# Patient Record
Sex: Female | Born: 1980 | Race: Black or African American | Hispanic: No | Marital: Single | Smoking: Current every day smoker
Health system: Southern US, Community
[De-identification: ages and names within clinical notes are randomized; demographics above are authoritative.]

## PROBLEM LIST (undated history)

## (undated) DIAGNOSIS — K861 Other chronic pancreatitis: Secondary | ICD-10-CM

## (undated) DIAGNOSIS — K863 Pseudocyst of pancreas: Secondary | ICD-10-CM

## (undated) DIAGNOSIS — I10 Essential (primary) hypertension: Secondary | ICD-10-CM

## (undated) DIAGNOSIS — D259 Leiomyoma of uterus, unspecified: Secondary | ICD-10-CM

## (undated) HISTORY — PX: ABDOMINAL HYSTERECTOMY: SHX81

---

## 2013-04-14 ENCOUNTER — Other Ambulatory Visit: Payer: Self-pay | Admitting: Gastroenterology

## 2013-04-14 DIAGNOSIS — R109 Unspecified abdominal pain: Secondary | ICD-10-CM

## 2013-04-16 ENCOUNTER — Ambulatory Visit
Admission: RE | Admit: 2013-04-16 | Discharge: 2013-04-16 | Disposition: A | Discharge status: Home / Self Care | Payer: No Typology Code available for payment source | Source: Ambulatory Visit | Attending: Gastroenterology | Admitting: Gastroenterology

## 2013-04-16 DIAGNOSIS — R109 Unspecified abdominal pain: Secondary | ICD-10-CM

## 2013-04-28 ENCOUNTER — Other Ambulatory Visit: Payer: Self-pay | Admitting: Gastroenterology

## 2013-04-28 DIAGNOSIS — K859 Acute pancreatitis without necrosis or infection, unspecified: Secondary | ICD-10-CM

## 2013-04-29 ENCOUNTER — Ambulatory Visit
Admission: RE | Admit: 2013-04-29 | Discharge: 2013-04-29 | Disposition: A | Discharge status: Home / Self Care | Payer: No Typology Code available for payment source | Source: Ambulatory Visit | Attending: Gastroenterology | Admitting: Gastroenterology

## 2013-04-29 DIAGNOSIS — K859 Acute pancreatitis without necrosis or infection, unspecified: Secondary | ICD-10-CM

## 2016-04-20 ENCOUNTER — Encounter (HOSPITAL_COMMUNITY): Payer: Self-pay | Admitting: *Deleted

## 2016-04-20 ENCOUNTER — Emergency Department (HOSPITAL_COMMUNITY)
Admission: EM | Admit: 2016-04-20 | Discharge: 2016-04-20 | Disposition: A | Discharge status: Home / Self Care | Payer: BLUE CROSS/BLUE SHIELD | Attending: Emergency Medicine | Admitting: Emergency Medicine

## 2016-04-20 ENCOUNTER — Emergency Department (HOSPITAL_COMMUNITY): Payer: BLUE CROSS/BLUE SHIELD

## 2016-04-20 DIAGNOSIS — Z86018 Personal history of other benign neoplasm: Secondary | ICD-10-CM | POA: Diagnosis not present

## 2016-04-20 DIAGNOSIS — R1013 Epigastric pain: Secondary | ICD-10-CM

## 2016-04-20 DIAGNOSIS — R110 Nausea: Secondary | ICD-10-CM | POA: Insufficient documentation

## 2016-04-20 DIAGNOSIS — R1011 Right upper quadrant pain: Secondary | ICD-10-CM | POA: Insufficient documentation

## 2016-04-20 DIAGNOSIS — F17210 Nicotine dependence, cigarettes, uncomplicated: Secondary | ICD-10-CM | POA: Insufficient documentation

## 2016-04-20 DIAGNOSIS — Z79899 Other long term (current) drug therapy: Secondary | ICD-10-CM | POA: Diagnosis not present

## 2016-04-20 DIAGNOSIS — I10 Essential (primary) hypertension: Secondary | ICD-10-CM | POA: Insufficient documentation

## 2016-04-20 HISTORY — DX: Essential (primary) hypertension: I10

## 2016-04-20 HISTORY — DX: Leiomyoma of uterus, unspecified: D25.9

## 2016-04-20 LAB — URINALYSIS, ROUTINE W REFLEX MICROSCOPIC
Bilirubin Urine: NEGATIVE
GLUCOSE, UA: NEGATIVE mg/dL
Hgb urine dipstick: NEGATIVE
KETONES UR: 40 mg/dL — AB
Leukocytes, UA: NEGATIVE
Nitrite: NEGATIVE
PROTEIN: NEGATIVE mg/dL
Specific Gravity, Urine: 1.017 (ref 1.005–1.030)
pH: 7 (ref 5.0–8.0)

## 2016-04-20 LAB — CBC
HCT: 37.4 % (ref 36.0–46.0)
HEMOGLOBIN: 13.2 g/dL (ref 12.0–15.0)
MCH: 34.7 pg — ABNORMAL HIGH (ref 26.0–34.0)
MCHC: 35.3 g/dL (ref 30.0–36.0)
MCV: 98.4 fL (ref 78.0–100.0)
PLATELETS: 468 10*3/uL — AB (ref 150–400)
RBC: 3.8 MIL/uL — AB (ref 3.87–5.11)
RDW: 15.6 % — ABNORMAL HIGH (ref 11.5–15.5)
WBC: 6.4 10*3/uL (ref 4.0–10.5)

## 2016-04-20 LAB — LIPASE, BLOOD: LIPASE: 41 U/L (ref 11–51)

## 2016-04-20 LAB — COMPREHENSIVE METABOLIC PANEL
ALT: 12 U/L — AB (ref 14–54)
ANION GAP: 13 (ref 5–15)
AST: 18 U/L (ref 15–41)
Albumin: 4.6 g/dL (ref 3.5–5.0)
Alkaline Phosphatase: 62 U/L (ref 38–126)
BUN: <5 mg/dL — ABNORMAL LOW (ref 6–20)
CALCIUM: 10.4 mg/dL — AB (ref 8.9–10.3)
CO2: 22 mmol/L (ref 22–32)
CREATININE: 0.77 mg/dL (ref 0.44–1.00)
Chloride: 103 mmol/L (ref 101–111)
Glucose, Bld: 113 mg/dL — ABNORMAL HIGH (ref 65–99)
Potassium: 3.4 mmol/L — ABNORMAL LOW (ref 3.5–5.1)
Sodium: 138 mmol/L (ref 135–145)
Total Bilirubin: 1.1 mg/dL (ref 0.3–1.2)
Total Protein: 7.6 g/dL (ref 6.5–8.1)

## 2016-04-20 MED ORDER — FAMOTIDINE 20 MG PO TABS: 20.0000 mg | ORAL_TABLET | Freq: Two times a day (BID) | ORAL | Status: DC

## 2016-04-20 MED ORDER — ONDANSETRON HCL 4 MG/2ML IJ SOLN: 4.0000 mg | Freq: Once | INTRAMUSCULAR | Status: DC

## 2016-04-20 MED ORDER — GI COCKTAIL ~~LOC~~
30.0000 mL | Freq: Once | ORAL | Status: AC
  Administered 2016-04-20: 30 mL via ORAL
  Filled 2016-04-20: qty 30

## 2016-04-20 MED ORDER — SODIUM CHLORIDE 0.9 % IV BOLUS (SEPSIS)
1000.0000 mL | Freq: Once | INTRAVENOUS | Status: AC
  Administered 2016-04-20: 1000 mL via INTRAVENOUS

## 2016-04-20 NOTE — ED Notes (Signed)
Pt reports mid abdominal pain and nausea since Wednesday. Denies any urinary symptoms.

## 2016-04-20 NOTE — ED Provider Notes (Signed)
CSN: [id number]     Arrival date & time 04/20/16  1748 History   First MD Initiated Contact with Patient 04/20/16 1858     Chief Complaint  Patient presents with  . Abdominal Pain     (Consider location/radiation/quality/duration/timing/severity/associated sxs/prior Treatment) Patient is a 35 y.o. female presenting with general illness. The history is provided by the patient.  Illness Location:  Epigastric pain Severity:  Moderate Onset quality:  Gradual Duration:  4 days Timing:  Constant Progression:  Worsening Chronicity:  New Context:  S/p hysterectomy several years ago. 4 days of epigastric pain. Nausea in the morning. No emesis or diarrhea. No vaginal bleeding or vaginal discharge. No urinary symptoms. No fevers or chills. No chest pain or shortness of breath. No other infectious symptoms. Associated symptoms: abdominal pain and nausea   Associated symptoms: no chest pain, no cough, no diarrhea, no fever, no headaches, no rash, no shortness of breath and no vomiting     Past Medical History  Diagnosis Date  . Hypertension   . Uterine fibroid    Past Surgical History  Procedure Laterality Date  . Abdominal hysterectomy     No family history on file. Social History  Substance Use Topics  . Smoking status: Current Every Day Smoker -- 0.25 packs/day    Types: Cigarettes  . Smokeless tobacco: None  . Alcohol Use: None   OB History    No data available     Review of Systems  Constitutional: Negative for fever and chills.  Respiratory: Negative for cough and shortness of breath.   Cardiovascular: Negative for chest pain.  Gastrointestinal: Positive for nausea and abdominal pain. Negative for vomiting, diarrhea and blood in stool.  Genitourinary: Negative for dysuria, urgency, frequency, flank pain, vaginal bleeding and vaginal discharge.  Musculoskeletal: Negative for back pain.  Skin: Negative for rash.  Neurological: Negative for light-headedness and headaches.   All other systems reviewed and are negative.     Allergies  Review of patient's allergies indicates no known allergies.  Home Medications   Prior to Admission medications   Medication Sig Start Date End Date Taking? Authorizing Provider  lisinopril (PRINIVIL,ZESTRIL) 10 MG tablet Take 10 mg by mouth daily.   Yes Historical Provider, MD  Multiple Vitamin (MULTIVITAMIN WITH MINERALS) TABS tablet Take 1 tablet by mouth daily.   Yes Historical Provider, MD  famotidine (PEPCID) 20 MG tablet Take 1 tablet (20 mg total) by mouth 2 (two) times daily. 04/20/16   Maryan Puls, MD   BP 169/108 mmHg  Pulse 83  Temp(Src) 98.4 F (36.9 C) (Oral)  Resp 16  Ht 5\' 5"  (1.651 m)  Wt 65.998 kg  BMI 24.21 kg/m2  SpO2 99% Physical Exam  Constitutional: She is oriented to person, place, and time. She appears well-developed and well-nourished. No distress.  HENT:  Head: Normocephalic and atraumatic.  Mouth/Throat: Mucous membranes are not dry.  Eyes: EOM are normal. Pupils are equal, round, and reactive to light.  Neck: Normal range of motion.  Cardiovascular: Normal rate and regular rhythm.   Pulmonary/Chest: No tachypnea. No respiratory distress.  Abdominal: Soft. Normal appearance. There is tenderness in the right upper quadrant and epigastric area. There is guarding. There is no rebound, no CVA tenderness, no tenderness at McBurney's point and negative Murphy's sign.  Neurological: She is alert and oriented to person, place, and time. GCS eye subscore is 4. GCS verbal subscore is 5. GCS motor subscore is 6.  Skin: Skin is warm and dry.  ED Course  Procedures (including critical care time) Labs Review Labs Reviewed  COMPREHENSIVE METABOLIC PANEL - Abnormal; Notable for the following:    Potassium 3.4 (*)    Glucose, Bld 113 (*)    BUN <5 (*)    Calcium 10.4 (*)    ALT 12 (*)    All other components within normal limits  CBC - Abnormal; Notable for the following:    RBC 3.80 (*)     MCH 34.7 (*)    RDW 15.6 (*)    Platelets 468 (*)    All other components within normal limits  URINALYSIS, ROUTINE W REFLEX MICROSCOPIC (NOT AT [facility]) - Abnormal; Notable for the following:    APPearance CLOUDY (*)    Ketones, ur 40 (*)    All other components within normal limits  LIPASE, BLOOD    Imaging Review US Abdomen Complete  04/20/2016  CLINICAL DATA:  Epigastric and RIGHT upper quadrant pain with nausea since Wednesday night, history hypertension and smoking, initial encounter EXAM: ABDOMEN ULTRASOUND COMPLETE COMPARISON:  CT abdomen 04/29/2013, ultrasound abdomen 04/16/2013 FINDINGS: Gallbladder: Normally distended without stones or wall thickening. No pericholecystic fluid or sonographic Murphy sign. Common bile duct: Diameter: 6 mm diameter, upper normal. Liver: Upper normal echogenicity. Hepatopetal portal venous flow. Small hyperechoic nodule superiorly RIGHT lobe 19 x 11 x 9 mm. Additional LEFT lobe hyperechoic nodule 9 x 8 x 12 mm. No intrahepatic biliary dilatation. IVC: Normal appearance Pancreas: Incomplete visualization of head and tail due to bowel gas. Visualized portions normal appearance. Spleen: Normal appearance, 6.5 cm length Right Kidney: Length: 11.0 cm. Normal morphology without mass or hydronephrosis. Left Kidney: Length: 11.5 cm. Normal morphology without mass or hydronephrosis. Abdominal aorta: Normal caliber Other findings: No free fluid IMPRESSION: Two small hyperechoic foci are identified within the liver, measuring 19 mm and 12 mm in greatest size is. Potentially these could represent small hepatic hemangiomata though not diagnostic. These are not definitely seen on the prior ultrasound nor on the prior CT though this was limited by noncontrast technique. In the absence of a history of malignancy, recommend followup ultrasound in 6 months to establish stability. No additional sonographic abnormalities identified. Electronically Signed   By: Lavonia Dana M.D.   On:  04/20/2016 22:44   I have personally reviewed and evaluated these images and lab results as part of my medical decision-making.   EKG Interpretation None      MDM   Final diagnoses:  Epigastric pain    35 year old female presenting with epigastric pain. Feel most likely diagnosed with the patient is gastritis versus gastric or peptic ulcer disease. Patient is better and having some nausea without any vomiting. Pain gets worse after eating. She has some tenderness in her epigastric as well as her right upper quadrant. Got a right upper quadrant ultrasound which didn't show any evidence of gallbladder pathology. Lipase is normal's about pancreatitis. Abdomen was otherwise benign so doubt acute abdomen. Gave the patient a GI cocktail here with improvement in symptoms.  The patient is hypertensive here. She reports that she does have a history of hypertension. She reports that she takes her hypertensive medications as prescribed usually but forgot to today. Vital signs otherwise within normal limits. Not tachycardic tachypneic or hypoxic.  Got labs on the patient. No O abnormalities. No leukocytosis. No anemia. No evidence of UTI on urinalysis. She is not having any chest pain or shortness of breath to indicate acute pathology such as pneumonia pneumothorax or ACS.  She denies any vaginal bleeding or vaginal discharge. The patient is status post hysterectomy so not concerned about IUP.  We'll give the patient a prescription for Pepcid. Encouraged her to avoid spicy foods acidic foods. She does endorse drinking 2 glasses of wine per night on weeknights, and 3-4 glasses of wine per day on weekends. Told her to decrease alcohol intake and to avoid ibuprofen and Motrin use.  Patient does not have a primary care doctor. Referred her to primary care doctor here. Suggested that she consider following up with gastroenterology should she have persistence of symptoms after starting on Pepcid. Encouraged her  to use Tums when necessary for pain until the Pepcid starts working.  Strict return precautions provided regarding worsening abdominal pain, onset of fevers, inability to tolerate by mouth, or intractable vomiting and diarrhea.  Patient discharged in stable condition.  Maryan Puls, MD 04/21/16 [id number]  Pattricia Boss, MD 04/24/16 [id number]

## 2016-04-20 NOTE — Discharge Instructions (Signed)
[employer] Social Services [employer] is a great source of information about community services available.  Access by dialing 2-1-1 from anywhere in [place], or by website -  CustodianSupply.fi.   Other Local Resources (Updated 12/2015)  Social  Nutritional therapist Number and Address  Aging, Disability and [place] on wheels  Community meal sites  Transportation services  Adult day health care  [facility] [place] caregiver support services [place], [place]  Guardianship  Hearing loss (assistive technology, interpreters, etc.)  Low-income services (health care, child care, housing, financial and nutrition assistance)  Medicaid  Pregnancy services  Utility assistance  Veterans services [phone] [address], [place] [address]   [place] management  Family education  Information and referral [phone] [address], [place] [address]  [place] Social Services  Aging and [place] services  Deaf-Blind services  Disability services  Guardianship  Hearing loss (assistive technology, interpreters, etc.)  Low-income services (health care, child care, housing, financial and nutrition assistance)  Medicaid  Pregnancy services  Utility assistance  Veterans services [phone] [address] [place], [place] [address]  [place]  Aging and [place] services  Guardianship  Hearing loss (assistive technology, interpreters, etc.)  Low-income services (health care, child care, housing, financial and nutrition  assistance)  [facility]  Pregnancy services  Utility assistance  Veterans services [phone] [place], [place] [address]  [place] services  Guardianship  Hearing loss (assistive technology, interpreters, etc.)  Low-income services (health care, child care, housing, financial and nutrition assistance)  Medicaid  Pregnancy services  Utility assistance  Veterans services [phone] [place], [place] [address]   [place]  Aging and [place]  Hearing loss (assistive technology, interpreters, etc.)  Low-income services (health care, child care, housing, financial and nutrition assistance)  Medicaid  Pregnancy services  Utility assistance  Veterans services [phone] [address], [place], [place] [address]  [place] [facility] Social Services  Aging and [place]  Hearing loss (assistive technology, interpreters, etc.)  Low-income services (health care, child care, housing, financial and nutrition assistance)  Medicaid  Pregnancy services  Utility assistance  Veterans services [phone] [address], [place] [address]  [employer] of [facility] [place]  Serves adults age 73 and over and their families  Caregiver information  Foster grandparents  Special educational needs teacher meals  Refugee programs  Retired Energy manager (RSVP)  [facility])  [place]  [phone]  [address], [place] [address]  [phone]  [place], [place] [address]  [employer]  Help Line  Home Care  Living at [employer] on [place]  Support Groups  Care Partner Workshops  Referrals for chore and homemaker services [phone]  [address] [place], [place] [address]  The [place] assistance  Medication assistance  Rental assistance  Food pantry  Medication assistance  Housing assistance  Emergency food distribution  Utility assistance  [place] [facility] [place] [address] [place], [place] [phone] and Thursdays from [place] - 12 noon by appointment only) [address] [place], [place] [address]  [phone] [address] [place], [place] [address]

## 2016-04-21 ENCOUNTER — Telehealth: Payer: Self-pay | Admitting: *Deleted

## 2016-04-21 NOTE — Telephone Encounter (Signed)
Pharmacist suggested substitution of Zantac 150 mg po liquid, Bid. This will also be covered by insurance which was another concern. No further CM concerns at this time.

## 2016-09-30 IMAGING — US US ABDOMEN COMPLETE
1 series · 13 of 25 positions shown · non-contrast
Comparison: CT abdomen 04/29/2013, ultrasound abdomen 04/16/2013

CLINICAL DATA: Epigastric and RIGHT upper quadrant pain with nausea
since [REDACTED] night, history hypertension and smoking, initial
encounter

EXAM:
ABDOMEN ULTRASOUND COMPLETE

[Series 1: us abdomen complete · 0.15mm/px · 13 of 89 slices shown]
[im 1/89]
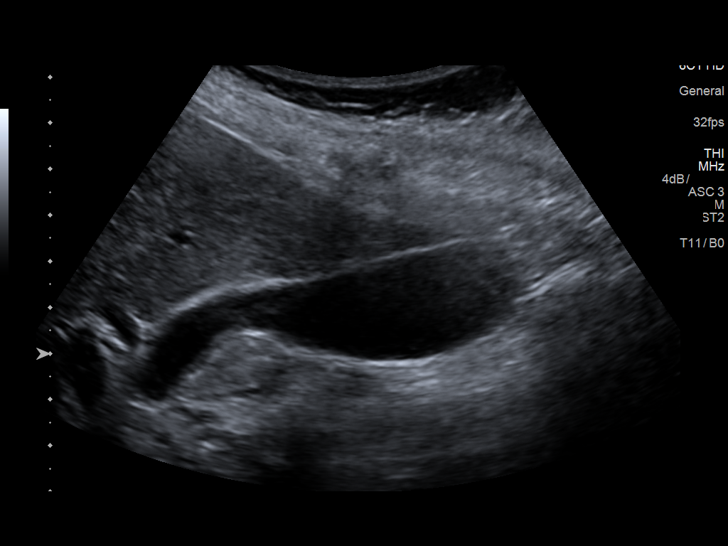
[im 8/89]
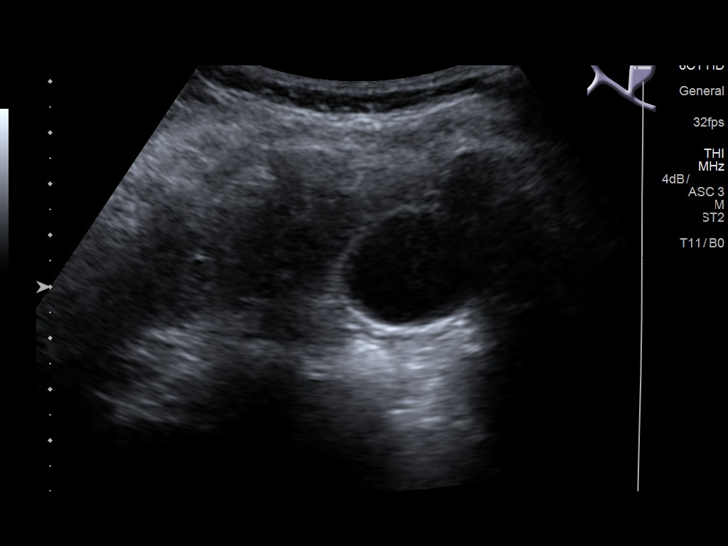
[im 15/89]
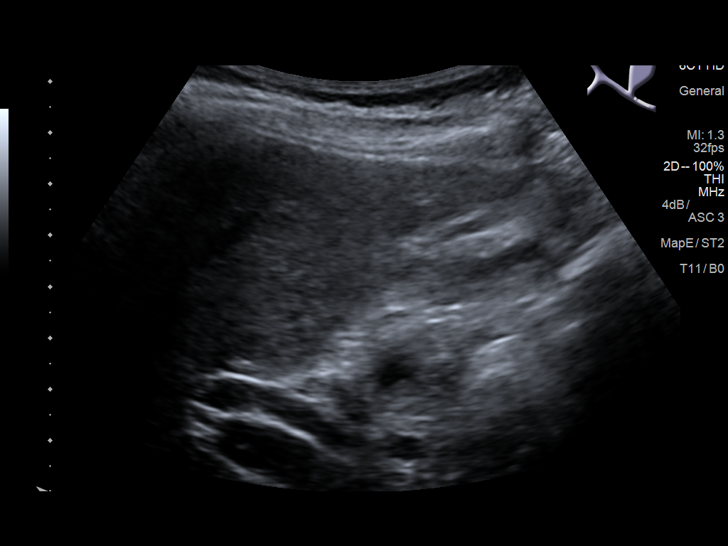
[im 23/89]
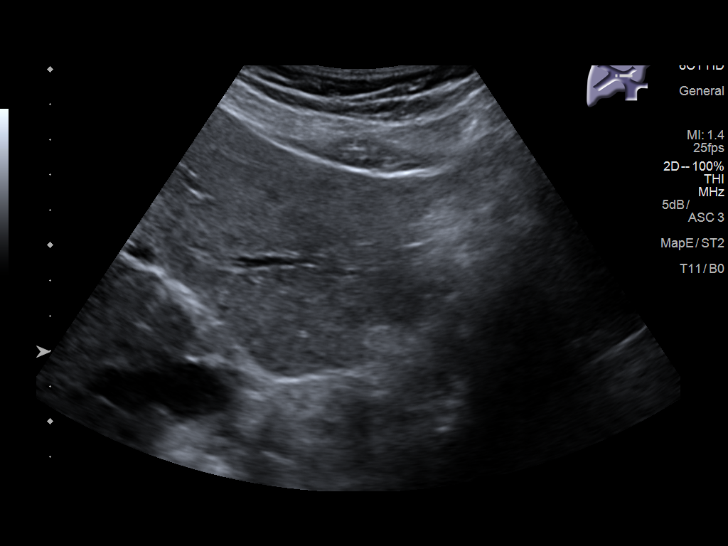
[im 30/89]
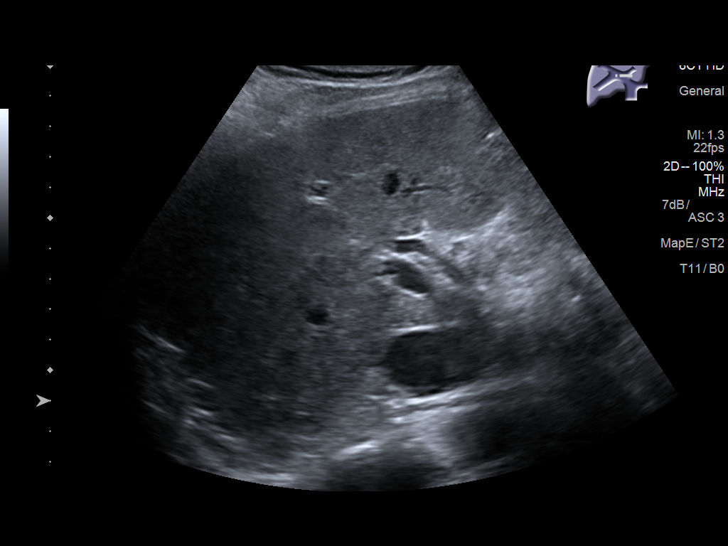
[im 37/89]
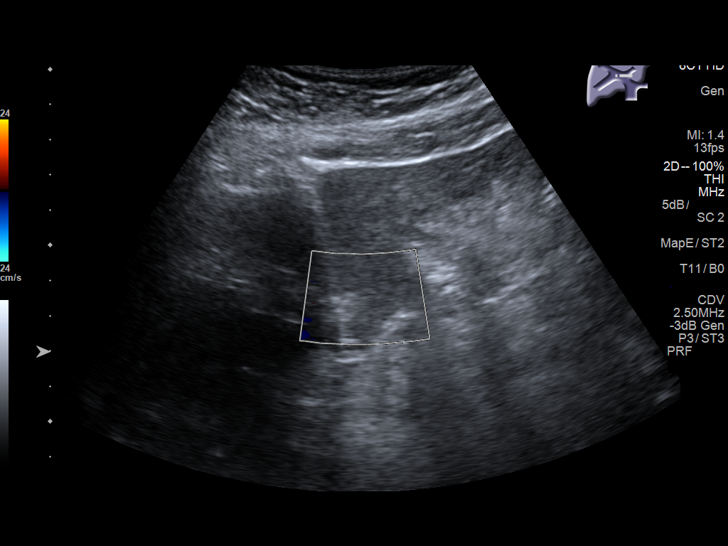
[im 45/89]
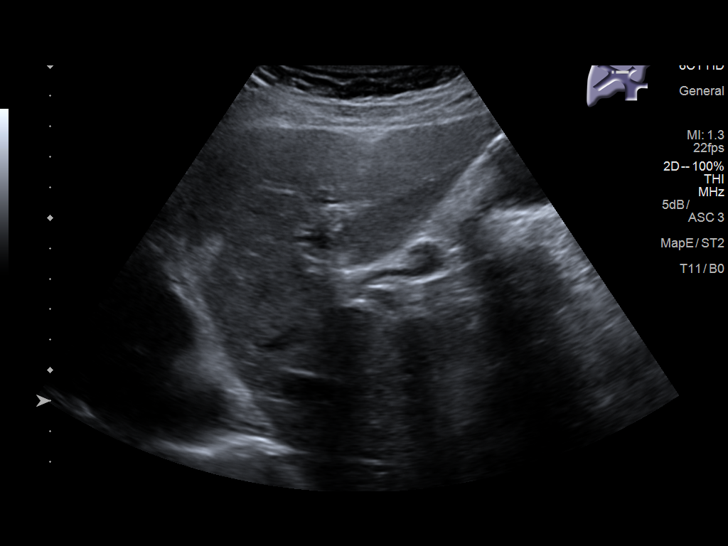
[im 52/89]
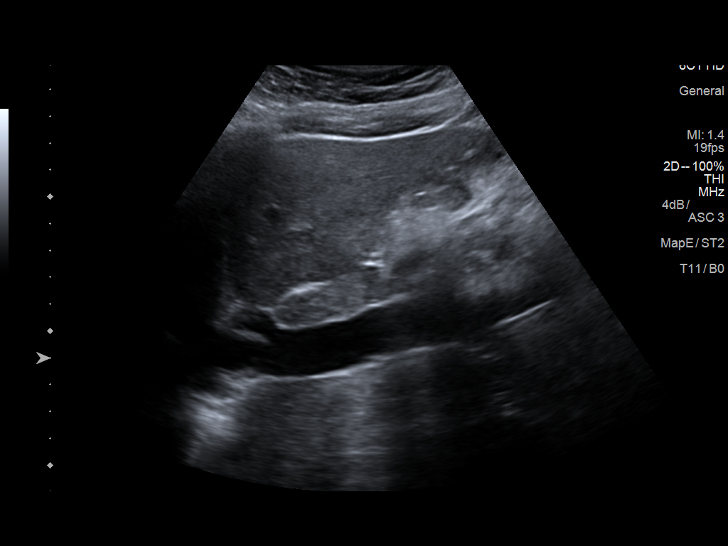
[im 59/89]
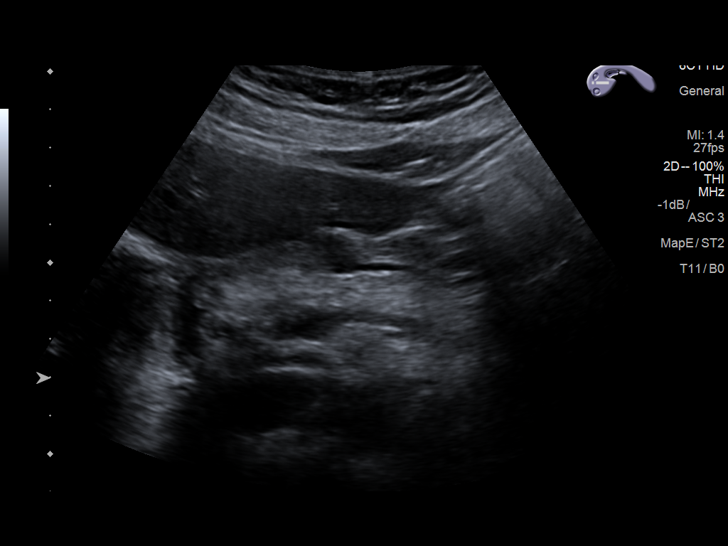
[im 67/89]
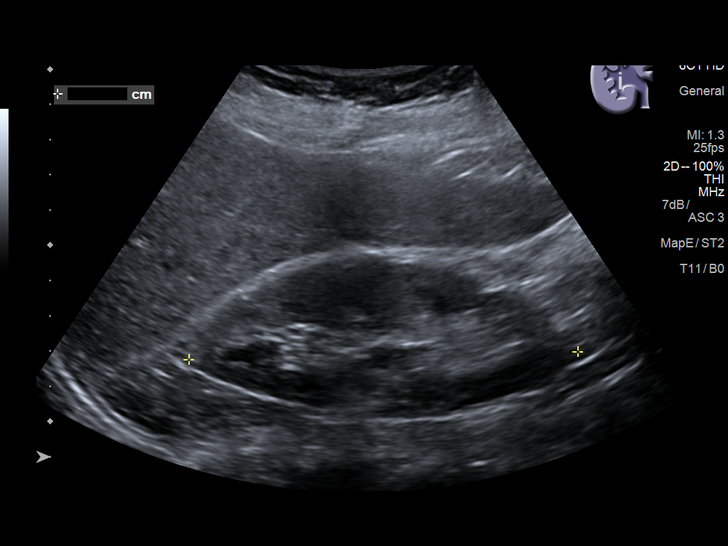
[im 74/89]
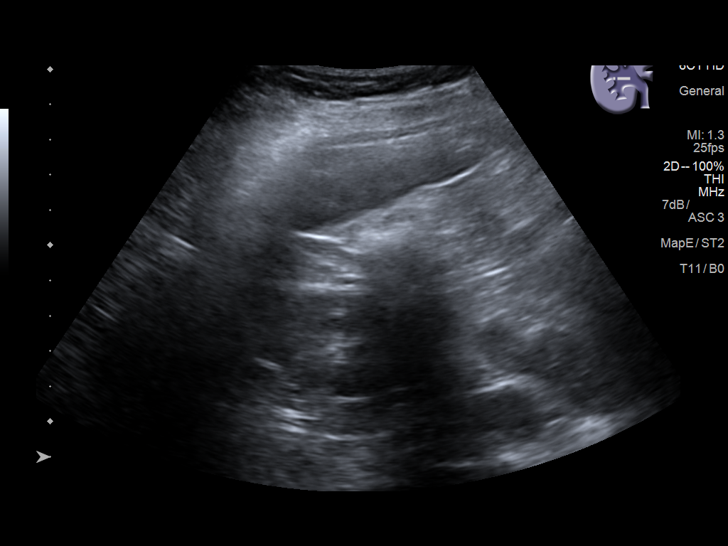
[im 81/89]
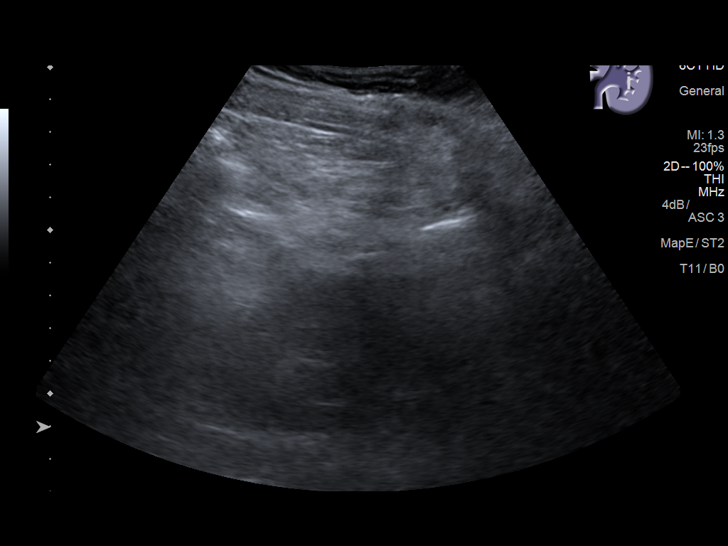
[im 89/89]
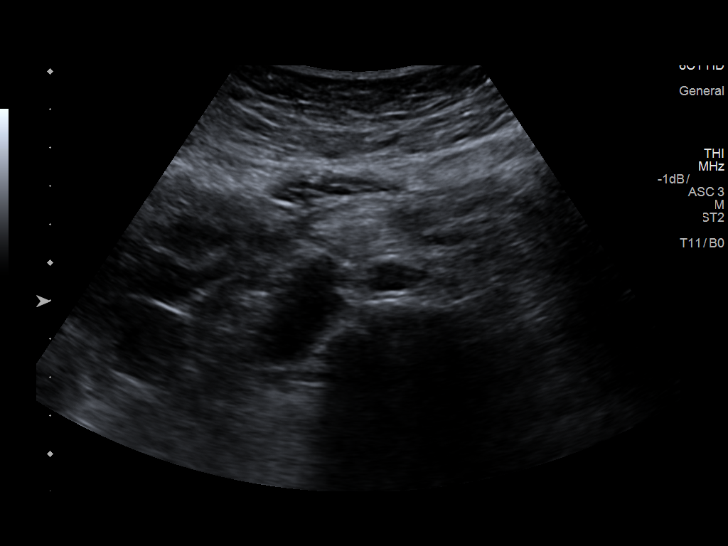

[13 of 25 positions shown; findings below may reference images not displayed]

FINDINGS: Gallbladder: Normally distended without stones or wall thickening.
No pericholecystic fluid or sonographic Murphy sign.

Common bile duct: Diameter: 6 mm diameter, upper normal.

Liver: Upper normal echogenicity. Hepatopetal portal venous flow.
Small hyperechoic nodule superiorly RIGHT lobe 19 x 11 x 9 mm.
Additional LEFT lobe hyperechoic nodule 9 x 8 x 12 mm. No
intrahepatic biliary dilatation.

IVC: Normal appearance

Pancreas: Incomplete visualization of head and tail due to bowel
gas. Visualized portions normal appearance.

Spleen: Normal appearance, 6.5 cm length

Right Kidney: Length: 11.0 cm. Normal morphology without mass or
hydronephrosis.

Left Kidney: Length: 11.5 cm. Normal morphology without mass or
hydronephrosis.

Abdominal aorta: Normal caliber

Other findings: No free fluid
IMPRESSION: Two small hyperechoic foci are identified within the liver,
measuring 19 mm and 12 mm in greatest size is.

Potentially these could represent small hepatic hemangiomata though
not diagnostic.

These are not definitely seen on the prior ultrasound nor on the
prior CT though this was limited by noncontrast technique.

In the absence of a history of malignancy, recommend followup
ultrasound in 6 months to establish stability.

No additional sonographic abnormalities identified.

## 2020-03-16 DIAGNOSIS — E0865 Diabetes mellitus due to underlying condition with hyperglycemia: Secondary | ICD-10-CM

## 2020-03-16 DIAGNOSIS — IMO0002 Reserved for concepts with insufficient information to code with codable children: Secondary | ICD-10-CM

## 2020-03-16 HISTORY — DX: Diabetes mellitus due to underlying condition with hyperglycemia: E08.65

## 2020-03-16 HISTORY — DX: Reserved for concepts with insufficient information to code with codable children: IMO0002

## 2020-04-13 ENCOUNTER — Other Ambulatory Visit: Payer: Self-pay

## 2020-04-13 ENCOUNTER — Emergency Department (HOSPITAL_COMMUNITY)
Admission: EM | Admit: 2020-04-13 | Discharge: 2020-04-13 | Discharge status: Absent without leave | Payer: BLUE CROSS/BLUE SHIELD

## 2020-07-01 ENCOUNTER — Encounter (HOSPITAL_COMMUNITY): Payer: Self-pay | Admitting: Internal Medicine

## 2020-07-01 ENCOUNTER — Emergency Department (HOSPITAL_COMMUNITY): Payer: No Typology Code available for payment source

## 2020-07-01 ENCOUNTER — Other Ambulatory Visit: Payer: Self-pay

## 2020-07-01 ENCOUNTER — Inpatient Hospital Stay (HOSPITAL_COMMUNITY)
Admission: EM | Admit: 2020-07-01 | Discharge: 2020-07-03 | DRG: 638 | Disposition: A | Discharge status: Home / Self Care | Payer: No Typology Code available for payment source | Attending: Internal Medicine | Admitting: Internal Medicine

## 2020-07-01 DIAGNOSIS — F17210 Nicotine dependence, cigarettes, uncomplicated: Secondary | ICD-10-CM

## 2020-07-01 DIAGNOSIS — Z90710 Acquired absence of both cervix and uterus: Secondary | ICD-10-CM

## 2020-07-01 DIAGNOSIS — E11649 Type 2 diabetes mellitus with hypoglycemia without coma: Principal | ICD-10-CM | POA: Diagnosis present

## 2020-07-01 DIAGNOSIS — K861 Other chronic pancreatitis: Secondary | ICD-10-CM

## 2020-07-01 DIAGNOSIS — R634 Abnormal weight loss: Secondary | ICD-10-CM | POA: Diagnosis present

## 2020-07-01 DIAGNOSIS — Z20822 Contact with and (suspected) exposure to covid-19: Secondary | ICD-10-CM | POA: Diagnosis present

## 2020-07-01 DIAGNOSIS — Z833 Family history of diabetes mellitus: Secondary | ICD-10-CM

## 2020-07-01 DIAGNOSIS — E0865 Diabetes mellitus due to underlying condition with hyperglycemia: Secondary | ICD-10-CM

## 2020-07-01 DIAGNOSIS — K863 Pseudocyst of pancreas: Secondary | ICD-10-CM | POA: Diagnosis present

## 2020-07-01 DIAGNOSIS — Z79899 Other long term (current) drug therapy: Secondary | ICD-10-CM

## 2020-07-01 DIAGNOSIS — IMO0002 Reserved for concepts with insufficient information to code with codable children: Secondary | ICD-10-CM | POA: Diagnosis present

## 2020-07-01 DIAGNOSIS — E162 Hypoglycemia, unspecified: Secondary | ICD-10-CM

## 2020-07-01 DIAGNOSIS — M25559 Pain in unspecified hip: Secondary | ICD-10-CM

## 2020-07-01 DIAGNOSIS — Z794 Long term (current) use of insulin: Secondary | ICD-10-CM

## 2020-07-01 DIAGNOSIS — D539 Nutritional anemia, unspecified: Secondary | ICD-10-CM | POA: Diagnosis present

## 2020-07-01 DIAGNOSIS — M25552 Pain in left hip: Secondary | ICD-10-CM | POA: Diagnosis present

## 2020-07-01 DIAGNOSIS — K219 Gastro-esophageal reflux disease without esophagitis: Secondary | ICD-10-CM

## 2020-07-01 DIAGNOSIS — I10 Essential (primary) hypertension: Secondary | ICD-10-CM

## 2020-07-01 DIAGNOSIS — Y92410 Unspecified street and highway as the place of occurrence of the external cause: Secondary | ICD-10-CM

## 2020-07-01 DIAGNOSIS — K8689 Other specified diseases of pancreas: Secondary | ICD-10-CM

## 2020-07-01 HISTORY — DX: Essential (primary) hypertension: I10

## 2020-07-01 HISTORY — DX: Gastro-esophageal reflux disease without esophagitis: K21.9

## 2020-07-01 HISTORY — DX: Pseudocyst of pancreas: K86.3

## 2020-07-01 HISTORY — DX: Nicotine dependence, cigarettes, uncomplicated: F17.210

## 2020-07-01 HISTORY — DX: Other chronic pancreatitis: K86.1

## 2020-07-01 LAB — COMPREHENSIVE METABOLIC PANEL
ALT: 36 U/L (ref 0–44)
AST: 77 U/L — ABNORMAL HIGH (ref 15–41)
Albumin: 4.5 g/dL (ref 3.5–5.0)
Alkaline Phosphatase: 53 U/L (ref 38–126)
Anion gap: 13 (ref 5–15)
BUN: 12 mg/dL (ref 6–20)
CO2: 22 mmol/L (ref 22–32)
Calcium: 9.6 mg/dL (ref 8.9–10.3)
Chloride: 106 mmol/L (ref 98–111)
Creatinine, Ser: 0.75 mg/dL (ref 0.44–1.00)
GFR calc Af Amer: >60 mL/min (ref >60)
GFR calc non Af Amer: >60 mL/min (ref >60)
Glucose, Bld: 46 mg/dL — ABNORMAL LOW (ref 70–99)
Potassium: 3.8 mmol/L (ref 3.5–5.1)
Sodium: 141 mmol/L (ref 135–145)
Total Bilirubin: 0.7 mg/dL (ref 0.3–1.2)
Total Protein: 7 g/dL (ref 6.5–8.1)

## 2020-07-01 LAB — CBC
HCT: 34.2 % — ABNORMAL LOW (ref 36.0–46.0)
Hemoglobin: 11.6 g/dL — ABNORMAL LOW (ref 12.0–15.0)
MCH: 36.1 pg — ABNORMAL HIGH (ref 26.0–34.0)
MCHC: 33.9 g/dL (ref 30.0–36.0)
MCV: 106.5 fL — ABNORMAL HIGH (ref 80.0–100.0)
Platelets: 492 10*3/uL — ABNORMAL HIGH (ref 150–400)
RBC: 3.21 MIL/uL — ABNORMAL LOW (ref 3.87–5.11)
RDW: 15.8 % — ABNORMAL HIGH (ref 11.5–15.5)
WBC: 10.7 10*3/uL — ABNORMAL HIGH (ref 4.0–10.5)
nRBC: 0 % (ref 0.0–0.2)

## 2020-07-01 LAB — CBG MONITORING, ED
Glucose-Capillary: 45 mg/dL — ABNORMAL LOW (ref 70–99)
Glucose-Capillary: 108 mg/dL — ABNORMAL HIGH (ref 70–99)
Glucose-Capillary: 214 mg/dL — ABNORMAL HIGH (ref 70–99)
Glucose-Capillary: 23 mg/dL — CL (ref 70–99)
Glucose-Capillary: 25 mg/dL — CL (ref 70–99)
Glucose-Capillary: 106 mg/dL — ABNORMAL HIGH (ref 70–99)
Glucose-Capillary: 340 mg/dL — ABNORMAL HIGH (ref 70–99)
Glucose-Capillary: 401 mg/dL — ABNORMAL HIGH (ref 70–99)

## 2020-07-01 LAB — I-STAT BETA HCG BLOOD, ED (MC, WL, AP ONLY): I-stat hCG, quantitative: <5 m[IU]/mL (ref <5)

## 2020-07-01 LAB — LIPASE, BLOOD: Lipase: 51 U/L (ref 11–51)

## 2020-07-01 LAB — SARS CORONAVIRUS 2 BY RT PCR (HOSPITAL ORDER, PERFORMED IN ~~LOC~~ HOSPITAL LAB): SARS Coronavirus 2: NEGATIVE

## 2020-07-01 MED ORDER — FAMOTIDINE 20 MG PO TABS: 20.0000 mg | ORAL_TABLET | Freq: Two times a day (BID) | ORAL | Status: DC

## 2020-07-01 MED ORDER — LISINOPRIL 10 MG PO TABS: 10.0000 mg | ORAL_TABLET | Freq: Every day | ORAL | Status: DC

## 2020-07-01 MED ORDER — DEXTROSE 10 % IV SOLN: INTRAVENOUS | Status: DC

## 2020-07-01 MED ORDER — ACETAMINOPHEN 650 MG RE SUPP: 650.0000 mg | Freq: Four times a day (QID) | RECTAL | Status: DC | PRN

## 2020-07-01 MED ORDER — INSULIN ASPART 100 UNIT/ML ~~LOC~~ SOLN
6.0000 [IU] | Freq: Once | SUBCUTANEOUS | Status: AC
  Administered 2020-07-01: 6 [IU] via SUBCUTANEOUS

## 2020-07-01 MED ORDER — ONDANSETRON HCL 4 MG/2ML IJ SOLN: 4.0000 mg | Freq: Four times a day (QID) | INTRAMUSCULAR | Status: DC | PRN

## 2020-07-01 MED ORDER — ENOXAPARIN SODIUM 40 MG/0.4ML ~~LOC~~ SOLN
40.0000 mg | SUBCUTANEOUS | Status: DC
  Administered 2020-07-01 – 2020-07-02 (×2): 40 mg via SUBCUTANEOUS
  Filled 2020-07-01 (×2): qty 0.4

## 2020-07-01 MED ORDER — DEXTROSE 50 % IV SOLN
50.0000 mL | Freq: Once | INTRAVENOUS | Status: AC
  Administered 2020-07-01: 50 mL via INTRAVENOUS

## 2020-07-01 MED ORDER — MORPHINE SULFATE (PF) 2 MG/ML IV SOLN
2.0000 mg | Freq: Once | INTRAVENOUS | Status: AC
  Administered 2020-07-01: 2 mg via INTRAVENOUS
  Filled 2020-07-01: qty 1

## 2020-07-01 MED ORDER — ADULT MULTIVITAMIN W/MINERALS CH
1.0000 | ORAL_TABLET | Freq: Every day | ORAL | Status: DC
  Administered 2020-07-02 – 2020-07-03 (×2): 1 via ORAL
  Filled 2020-07-01 (×2): qty 1

## 2020-07-01 MED ORDER — INSULIN ASPART 100 UNIT/ML ~~LOC~~ SOLN
0.0000 [IU] | SUBCUTANEOUS | Status: DC
  Administered 2020-07-01: 7 [IU] via SUBCUTANEOUS
  Administered 2020-07-02: 3 [IU] via SUBCUTANEOUS

## 2020-07-01 MED ORDER — NICOTINE 14 MG/24HR TD PT24
14.0000 mg | MEDICATED_PATCH | Freq: Every day | TRANSDERMAL | Status: DC
  Administered 2020-07-01 – 2020-07-03 (×3): 14 mg via TRANSDERMAL
  Filled 2020-07-01 (×3): qty 1

## 2020-07-01 MED ORDER — SODIUM CHLORIDE 0.9 % IV SOLN: 250.0000 mL | INTRAVENOUS | Status: DC | PRN

## 2020-07-01 MED ORDER — ONDANSETRON HCL 4 MG PO TABS: 4.0000 mg | ORAL_TABLET | Freq: Four times a day (QID) | ORAL | Status: DC | PRN

## 2020-07-01 MED ORDER — POLYETHYLENE GLYCOL 3350 17 G PO PACK
17.0000 g | PACK | Freq: Every day | ORAL | Status: DC | PRN
  Administered 2020-07-02: 17 g via ORAL
  Filled 2020-07-01: qty 1

## 2020-07-01 MED ORDER — DEXTROSE 50 % IV SOLN
1.0000 | Freq: Once | INTRAVENOUS | Status: AC
  Administered 2020-07-01: 50 mL via INTRAVENOUS

## 2020-07-01 MED ORDER — ACETAMINOPHEN 325 MG PO TABS: 650.0000 mg | ORAL_TABLET | Freq: Four times a day (QID) | ORAL | Status: DC | PRN

## 2020-07-01 MED ORDER — ASCORBIC ACID 500 MG PO TABS
1000.0000 mg | ORAL_TABLET | Freq: Every day | ORAL | Status: DC
  Administered 2020-07-02 – 2020-07-03 (×2): 1000 mg via ORAL
  Filled 2020-07-01 (×2): qty 2

## 2020-07-01 MED ORDER — SODIUM CHLORIDE 0.9% FLUSH: 3.0000 mL | INTRAVENOUS | Status: DC | PRN

## 2020-07-01 MED ORDER — LISINOPRIL 20 MG PO TABS
20.0000 mg | ORAL_TABLET | Freq: Every day | ORAL | Status: DC
  Administered 2020-07-02 – 2020-07-03 (×2): 20 mg via ORAL
  Filled 2020-07-01 (×2): qty 1

## 2020-07-01 MED ORDER — SODIUM CHLORIDE 0.9% FLUSH
3.0000 mL | Freq: Two times a day (BID) | INTRAVENOUS | Status: DC
  Administered 2020-07-02 (×2): 3 mL via INTRAVENOUS

## 2020-07-01 MED ORDER — PANTOPRAZOLE SODIUM 40 MG PO TBEC
40.0000 mg | DELAYED_RELEASE_TABLET | Freq: Every day | ORAL | Status: DC
  Administered 2020-07-02: 40 mg via ORAL
  Filled 2020-07-01 (×2): qty 1

## 2020-07-01 MED ORDER — OXYCODONE-ACETAMINOPHEN 5-325 MG PO TABS
1.0000 | ORAL_TABLET | Freq: Four times a day (QID) | ORAL | Status: DC | PRN
  Administered 2020-07-02: 1 via ORAL
  Filled 2020-07-01: qty 1

## 2020-07-01 MED ORDER — KETOROLAC TROMETHAMINE 30 MG/ML IJ SOLN
30.0000 mg | Freq: Four times a day (QID) | INTRAMUSCULAR | Status: DC | PRN
  Administered 2020-07-01 – 2020-07-02 (×4): 30 mg via INTRAVENOUS
  Filled 2020-07-01 (×6): qty 1

## 2020-07-01 MED ORDER — MORPHINE SULFATE (PF) 4 MG/ML IV SOLN
4.0000 mg | Freq: Once | INTRAVENOUS | Status: AC
  Administered 2020-07-01: 4 mg via INTRAVENOUS
  Filled 2020-07-01: qty 1
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
## 2020-07-02 DIAGNOSIS — K219 Gastro-esophageal reflux disease without esophagitis: Secondary | ICD-10-CM | POA: Diagnosis present

## 2020-07-02 DIAGNOSIS — Z833 Family history of diabetes mellitus: Secondary | ICD-10-CM | POA: Diagnosis not present

## 2020-07-02 DIAGNOSIS — E11649 Type 2 diabetes mellitus with hypoglycemia without coma: Secondary | ICD-10-CM | POA: Diagnosis present

## 2020-07-02 DIAGNOSIS — I10 Essential (primary) hypertension: Secondary | ICD-10-CM | POA: Diagnosis present

## 2020-07-02 DIAGNOSIS — Z90710 Acquired absence of both cervix and uterus: Secondary | ICD-10-CM | POA: Diagnosis not present

## 2020-07-02 DIAGNOSIS — K861 Other chronic pancreatitis: Secondary | ICD-10-CM | POA: Diagnosis present

## 2020-07-02 DIAGNOSIS — M25552 Pain in left hip: Secondary | ICD-10-CM | POA: Diagnosis present

## 2020-07-02 DIAGNOSIS — K863 Pseudocyst of pancreas: Secondary | ICD-10-CM | POA: Diagnosis present

## 2020-07-02 DIAGNOSIS — D539 Nutritional anemia, unspecified: Secondary | ICD-10-CM | POA: Diagnosis present

## 2020-07-02 DIAGNOSIS — F17210 Nicotine dependence, cigarettes, uncomplicated: Secondary | ICD-10-CM | POA: Diagnosis present

## 2020-07-02 DIAGNOSIS — R634 Abnormal weight loss: Secondary | ICD-10-CM | POA: Diagnosis present

## 2020-07-02 DIAGNOSIS — Z79899 Other long term (current) drug therapy: Secondary | ICD-10-CM | POA: Diagnosis not present

## 2020-07-02 DIAGNOSIS — Z794 Long term (current) use of insulin: Secondary | ICD-10-CM | POA: Diagnosis not present

## 2020-07-02 DIAGNOSIS — Y92410 Unspecified street and highway as the place of occurrence of the external cause: Secondary | ICD-10-CM | POA: Diagnosis not present

## 2020-07-02 DIAGNOSIS — Z20822 Contact with and (suspected) exposure to covid-19: Secondary | ICD-10-CM | POA: Diagnosis present

## 2020-07-02 LAB — CBC WITH DIFFERENTIAL/PLATELET
Abs Immature Granulocytes: 0.04 10*3/uL (ref 0.00–0.07)
Basophils Absolute: 0 10*3/uL (ref 0.0–0.1)
Basophils Relative: 0 %
Eosinophils Absolute: 0 10*3/uL (ref 0.0–0.5)
Eosinophils Relative: 0 %
HCT: 28.8 % — ABNORMAL LOW (ref 36.0–46.0)
Hemoglobin: 10.4 g/dL — ABNORMAL LOW (ref 12.0–15.0)
Immature Granulocytes: 0 %
Lymphocytes Relative: 29 %
Lymphs Abs: 2.7 10*3/uL (ref 0.7–4.0)
MCH: 36.6 pg — ABNORMAL HIGH (ref 26.0–34.0)
MCHC: 36.1 g/dL — ABNORMAL HIGH (ref 30.0–36.0)
MCV: 101.4 fL — ABNORMAL HIGH (ref 80.0–100.0)
Monocytes Absolute: 0.8 10*3/uL (ref 0.1–1.0)
Monocytes Relative: 8 %
Neutro Abs: 6 10*3/uL (ref 1.7–7.7)
Neutrophils Relative %: 63 %
Platelets: 427 10*3/uL — ABNORMAL HIGH (ref 150–400)
RBC: 2.84 MIL/uL — ABNORMAL LOW (ref 3.87–5.11)
RDW: 14.4 % (ref 11.5–15.5)
WBC: 9.6 10*3/uL (ref 4.0–10.5)
nRBC: 0 % (ref 0.0–0.2)

## 2020-07-02 LAB — GLUCOSE, CAPILLARY
Glucose-Capillary: 15 mg/dL — CL (ref 70–99)
Glucose-Capillary: 170 mg/dL — ABNORMAL HIGH (ref 70–99)
Glucose-Capillary: 204 mg/dL — ABNORMAL HIGH (ref 70–99)
Glucose-Capillary: 219 mg/dL — ABNORMAL HIGH (ref 70–99)
Glucose-Capillary: 221 mg/dL — ABNORMAL HIGH (ref 70–99)
Glucose-Capillary: 240 mg/dL — ABNORMAL HIGH (ref 70–99)
Glucose-Capillary: 279 mg/dL — ABNORMAL HIGH (ref 70–99)
Glucose-Capillary: 289 mg/dL — ABNORMAL HIGH (ref 70–99)
Glucose-Capillary: 74 mg/dL (ref 70–99)
Glucose-Capillary: 87 mg/dL (ref 70–99)
Glucose-Capillary: 91 mg/dL (ref 70–99)
Glucose-Capillary: 92 mg/dL (ref 70–99)

## 2020-07-02 LAB — COMPREHENSIVE METABOLIC PANEL
ALT: 24 U/L (ref 0–44)
AST: 36 U/L (ref 15–41)
Albumin: 3.5 g/dL (ref 3.5–5.0)
Alkaline Phosphatase: 49 U/L (ref 38–126)
Anion gap: 10 (ref 5–15)
BUN: 14 mg/dL (ref 6–20)
CO2: 27 mmol/L (ref 22–32)
Calcium: 9.1 mg/dL (ref 8.9–10.3)
Chloride: 104 mmol/L (ref 98–111)
Creatinine, Ser: 0.7 mg/dL (ref 0.44–1.00)
GFR calc Af Amer: >60 mL/min (ref >60)
GFR calc non Af Amer: >60 mL/min (ref >60)
Glucose, Bld: <20 mg/dL — CL (ref 70–99)
Potassium: 3.2 mmol/L — ABNORMAL LOW (ref 3.5–5.1)
Sodium: 141 mmol/L (ref 135–145)
Total Bilirubin: 0.6 mg/dL (ref 0.3–1.2)
Total Protein: 5.7 g/dL — ABNORMAL LOW (ref 6.5–8.1)

## 2020-07-02 LAB — HEMOGLOBIN A1C
Hgb A1c MFr Bld: 7.1 % — ABNORMAL HIGH (ref 4.8–5.6)
Mean Plasma Glucose: 157.07 mg/dL

## 2020-07-02 LAB — HIV ANTIBODY (ROUTINE TESTING W REFLEX): HIV Screen 4th Generation wRfx: NONREACTIVE

## 2020-07-02 LAB — MAGNESIUM: Magnesium: 2 mg/dL (ref 1.7–2.4)

## 2020-07-02 LAB — VITAMIN B12: Vitamin B-12: 236 pg/mL (ref 180–914)

## 2020-07-02 MED ORDER — DEXTROSE 5 % IV SOLN: INTRAVENOUS | Status: AC

## 2020-07-02 MED ORDER — POTASSIUM CHLORIDE CRYS ER 20 MEQ PO TBCR
40.0000 meq | EXTENDED_RELEASE_TABLET | Freq: Three times a day (TID) | ORAL | Status: AC
  Administered 2020-07-02 (×3): 40 meq via ORAL
  Filled 2020-07-02 (×3): qty 2

## 2020-07-02 MED ORDER — INSULIN DETEMIR 100 UNIT/ML ~~LOC~~ SOLN
2.0000 [IU] | Freq: Every day | SUBCUTANEOUS | Status: DC
  Filled 2020-07-02 (×2): qty 0.02

## 2020-07-02 MED ORDER — INSULIN DETEMIR 100 UNIT/ML ~~LOC~~ SOLN
5.0000 [IU] | Freq: Every day | SUBCUTANEOUS | Status: DC
  Filled 2020-07-02: qty 0.05

## 2020-07-02 MED ORDER — DEXTROSE 50 % IV SOLN
INTRAVENOUS | Status: AC
  Administered 2020-07-02: 50 mL
  Filled 2020-07-02: qty 50

## 2020-07-02 MED ORDER — INSULIN DETEMIR 100 UNIT/ML ~~LOC~~ SOLN
2.0000 [IU] | Freq: Every day | SUBCUTANEOUS | Status: DC
  Filled 2020-07-02: qty 0.02

## 2020-07-02 MED ORDER — INSULIN ASPART 100 UNIT/ML ~~LOC~~ SOLN
0.0000 [IU] | Freq: Three times a day (TID) | SUBCUTANEOUS | Status: DC
  Administered 2020-07-02 – 2020-07-03 (×2): 3 [IU] via SUBCUTANEOUS
  Administered 2020-07-03: 2 [IU] via SUBCUTANEOUS

## 2020-07-02 MED ORDER — INSULIN ASPART 100 UNIT/ML ~~LOC~~ SOLN
2.0000 [IU] | Freq: Three times a day (TID) | SUBCUTANEOUS | Status: DC
  Administered 2020-07-02 – 2020-07-03 (×3): 2 [IU] via SUBCUTANEOUS

## 2020-07-02 NOTE — Progress Notes (Signed)
Pt complains of feeling shaky.  Pt diaphoretic.  Pt CBG was low on glucometer. 15 CBG on recheck.  Informed provider.  Provider responded with orders.

## 2020-07-02 NOTE — Plan of Care (Signed)
  Problem: Education: Goal: Knowledge of General Education information will improve Description: Including pain rating scale, medication(s)/side effects and non-pharmacologic comfort measures Outcome: Progressing   Problem: Coping: Goal: Level of anxiety will decrease Outcome: Progressing   Problem: Pain Managment: Goal: General experience of comfort will improve Outcome: Progressing   
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
## 2020-07-03 DIAGNOSIS — E11649 Type 2 diabetes mellitus with hypoglycemia without coma: Principal | ICD-10-CM

## 2020-07-03 LAB — GLUCOSE, CAPILLARY
Glucose-Capillary: 23 mg/dL — CL (ref 70–99)
Glucose-Capillary: <10 mg/dL — CL (ref 70–99)
Glucose-Capillary: 278 mg/dL — ABNORMAL HIGH (ref 70–99)
Glucose-Capillary: 253 mg/dL — ABNORMAL HIGH (ref 70–99)
Glucose-Capillary: 209 mg/dL — ABNORMAL HIGH (ref 70–99)

## 2020-07-03 LAB — HEMOGLOBIN A1C
Hgb A1c MFr Bld: 7.2 % — ABNORMAL HIGH (ref 4.8–5.6)
Mean Plasma Glucose: 160 mg/dL

## 2020-07-03 MED ORDER — INSULIN DETEMIR 100 UNIT/ML ~~LOC~~ SOLN
5.0000 [IU] | Freq: Every day | SUBCUTANEOUS | Status: DC
  Filled 2020-07-03: qty 0.05

## 2020-07-03 MED ORDER — CYANOCOBALAMIN 1000 MCG/ML IJ SOLN
1000.0000 ug | Freq: Once | INTRAMUSCULAR | Status: AC
  Administered 2020-07-03: 1000 ug via INTRAMUSCULAR
  Filled 2020-07-03: qty 1

## 2020-07-03 MED ORDER — INSULIN DETEMIR 100 UNIT/ML ~~LOC~~ SOLN
10.0000 [IU] | Freq: Every day | SUBCUTANEOUS | Status: DC
  Administered 2020-07-03: 10 [IU] via SUBCUTANEOUS
  Filled 2020-07-03: qty 0.1

## 2020-07-03 MED ORDER — INSULIN DETEMIR 100 UNIT/ML ~~LOC~~ SOLN: 5.0000 [IU] | Freq: Every day | SUBCUTANEOUS | Status: DC

## 2020-07-03 MED ORDER — CYANOCOBALAMIN 500 MCG PO TABS: 500.0000 ug | ORAL_TABLET | Freq: Every day | ORAL | 3 refills | Status: AC

## 2020-07-03 MED ORDER — VITAMIN B-12 1000 MCG PO TABS: 500.0000 ug | ORAL_TABLET | Freq: Every day | ORAL | Status: DC

## 2020-07-03 MED ORDER — INSULIN DEGLUDEC 100 UNIT/ML ~~LOC~~ SOPN: 10.0000 [IU] | PEN_INJECTOR | Freq: Every day | SUBCUTANEOUS | Status: AC

## 2020-07-03 NOTE — Discharge Instructions (Signed)
Suzanne Weeks was admitted to the Hospital on 07/01/2020 and Discharged on Discharge Date 07/03/2020 and should be excused from work/school   for 5   days starting 07/01/2020 , may return to work/school without any restrictions.  Call [name] MD, [place] [facility] [phone] with questions.  [name] M.D on 07/03/2020,at 12:48 PM  [facility] Office  [phone]

## 2020-07-03 NOTE — Plan of Care (Signed)
  Problem: Education: Goal: Knowledge of General Education information will improve Description: Including pain rating scale, medication(s)/side effects and non-pharmacologic comfort measures Outcome: Adequate for Discharge   Problem: Health Behavior/Discharge Planning: Goal: Ability to manage health-related needs will improve Outcome: Adequate for Discharge   Problem: Clinical Measurements: Goal: Ability to maintain clinical measurements within normal limits will improve Outcome: Adequate for Discharge Goal: Will remain free from infection Outcome: Adequate for Discharge Goal: Diagnostic test results will improve Outcome: Adequate for Discharge Goal: Cardiovascular complication will be avoided Outcome: Adequate for Discharge   Problem: Activity: Goal: Risk for activity intolerance will decrease Outcome: Adequate for Discharge   Problem: Nutrition: Goal: Adequate nutrition will be maintained Outcome: Adequate for Discharge   Problem: Coping: Goal: Level of anxiety will decrease Outcome: Adequate for Discharge   Problem: Pain Managment: Goal: General experience of comfort will improve Outcome: Adequate for Discharge   Problem: Safety: Goal: Ability to remain free from injury will improve Outcome: Adequate for Discharge
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
## 2020-07-03 NOTE — Progress Notes (Signed)
D/C orders given and reviewed. Pt voiced concerns about Novolog dose and care for left arm abrasion. MD messaged, awaiting response. IV removed. Tolerated well.

## 2020-07-04 LAB — FOLATE RBC
Folate, Hemolysate: 385 ng/mL
Folate, RBC: 1275 ng/mL (ref >498)
Hematocrit: 30.2 % — ABNORMAL LOW (ref 34.0–46.6)

## 2020-07-04 LAB — GLUCOSE, CAPILLARY: Glucose-Capillary: 17 mg/dL — CL (ref 70–99)

## 2021-08-18 IMAGING — CR DG HIP (WITH OR WITHOUT PELVIS) 2-3V*L*
3 series · 3 of 3 positions shown · non-contrast
Comparison: None.

CLINICAL DATA: Hip pain.

EXAM:
DG HIP (WITH OR WITHOUT PELVIS) 2-3V LEFT

[pelvis ap]
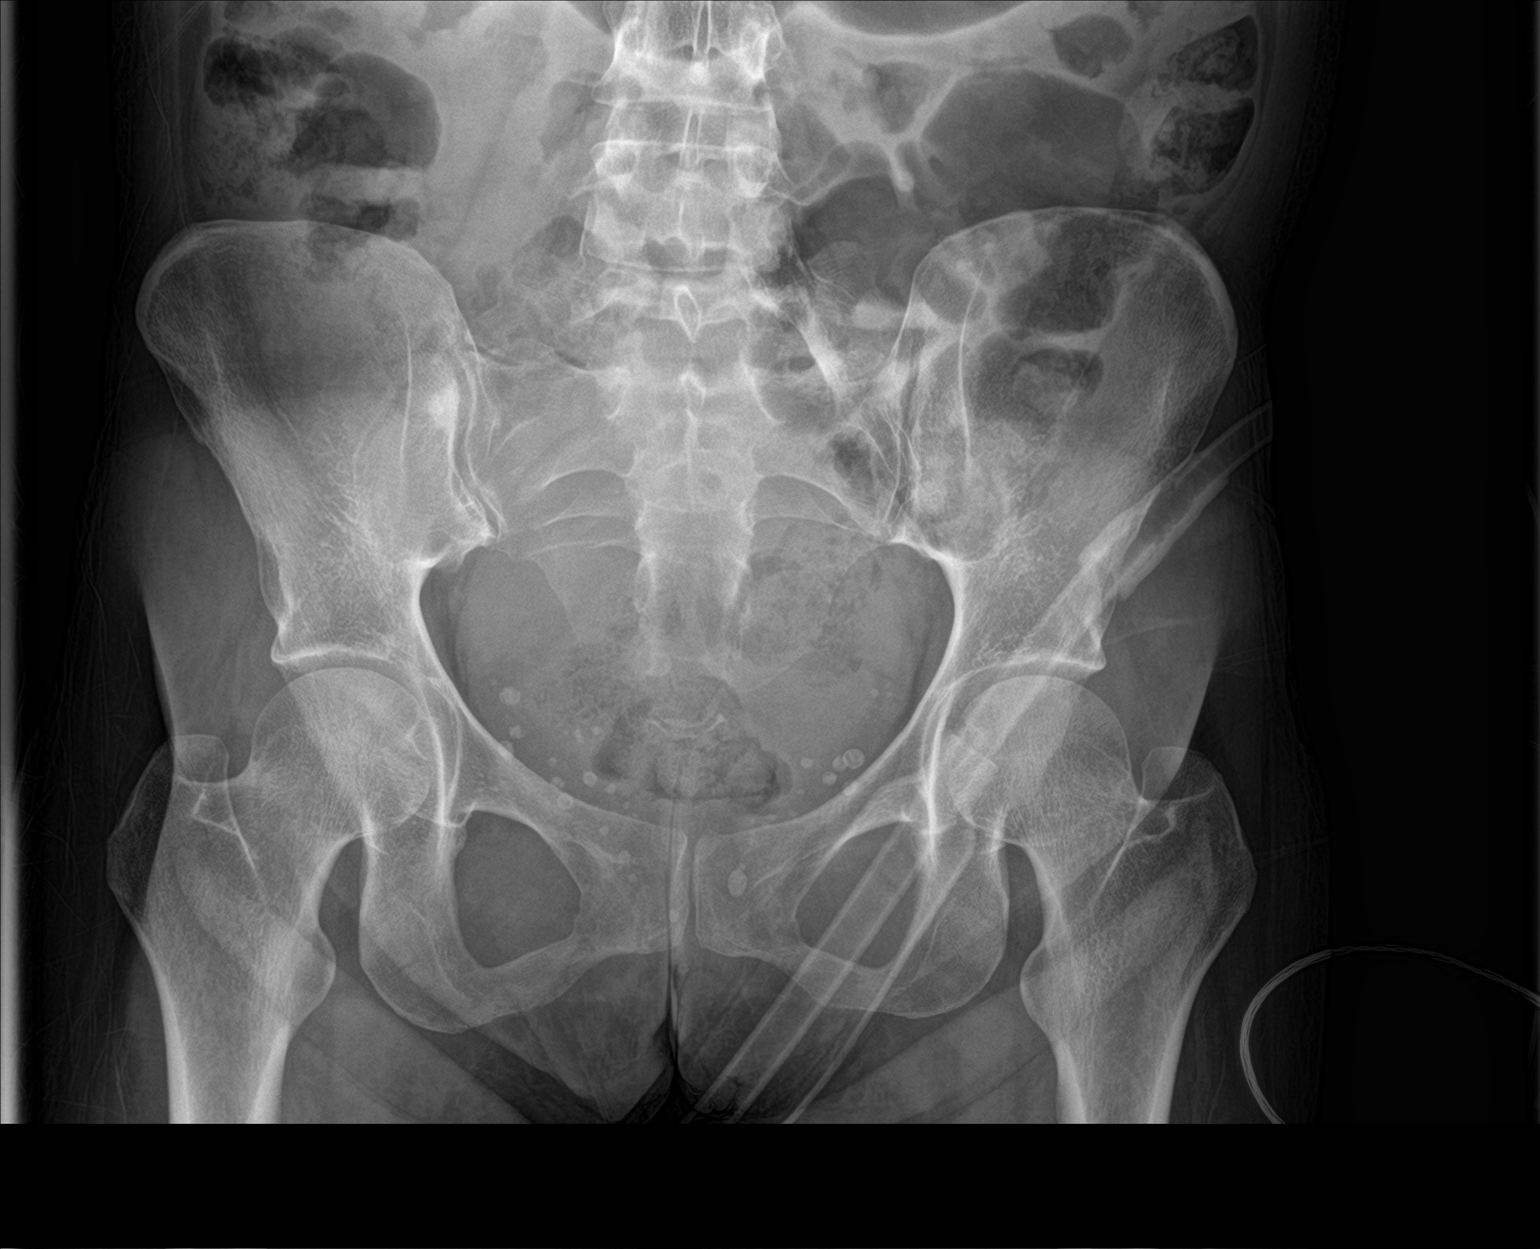

[hip ap]
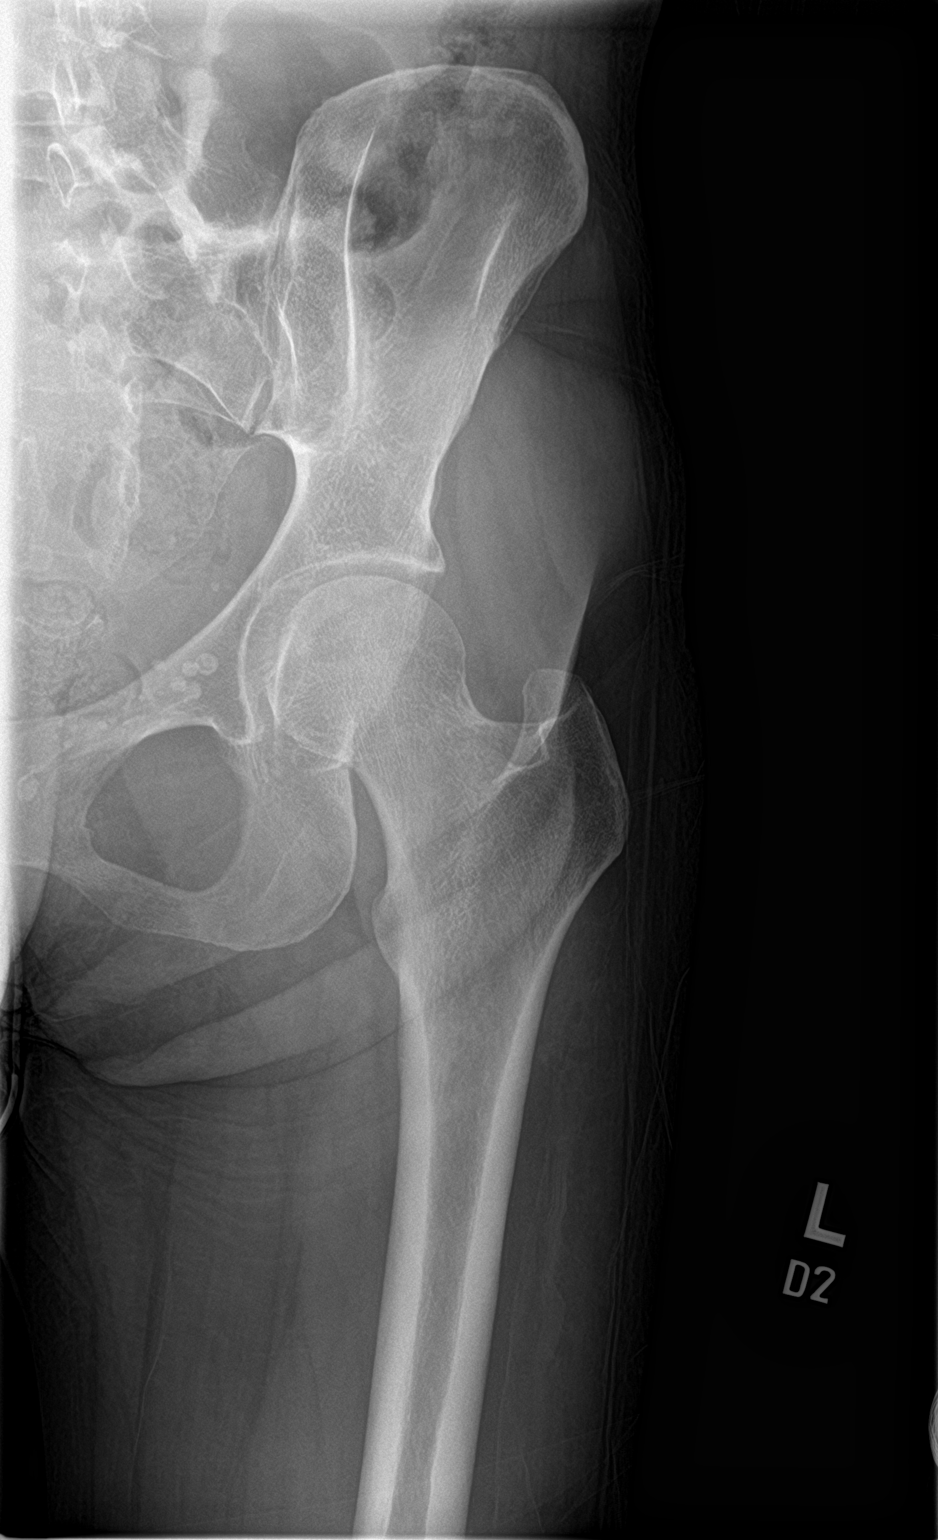

[hip lat]
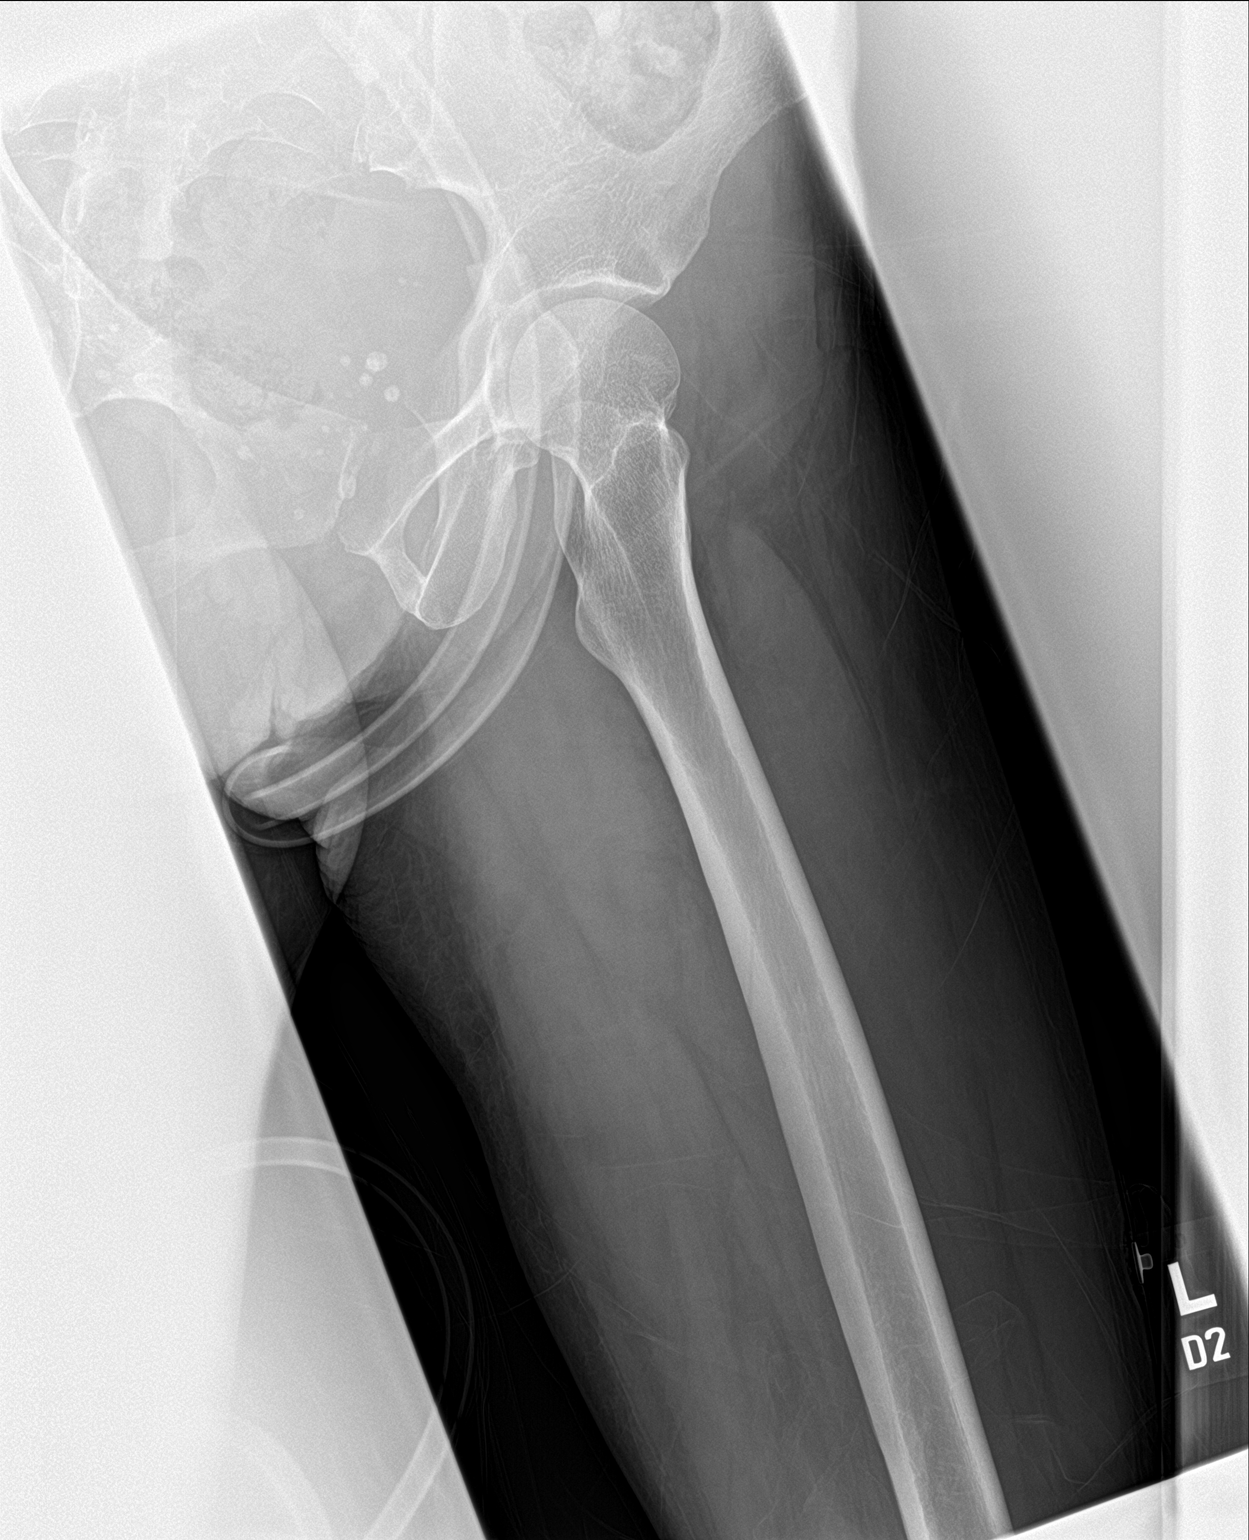

[3 of 3 positions shown; findings below may reference images not displayed]

FINDINGS: There is no evidence of an acute hip fracture or dislocation. There
is no evidence of arthropathy or other focal bone abnormality.
Multiple subcentimeter phleboliths are seen within the pelvis.
IMPRESSION: No acute osseous abnormality.
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
## 2021-08-20 ENCOUNTER — Observation Stay (HOSPITAL_COMMUNITY): Payer: BC Managed Care – PPO

## 2021-08-20 ENCOUNTER — Other Ambulatory Visit: Payer: Self-pay

## 2021-08-20 ENCOUNTER — Inpatient Hospital Stay (HOSPITAL_COMMUNITY)
Admission: EM | Admit: 2021-08-20 | Discharge: 2021-08-24 | DRG: 186 | Disposition: A | Discharge status: Home / Self Care | Payer: BC Managed Care – PPO | Attending: Internal Medicine | Admitting: Internal Medicine

## 2021-08-20 ENCOUNTER — Emergency Department (HOSPITAL_COMMUNITY): Payer: BC Managed Care – PPO

## 2021-08-20 DIAGNOSIS — J9601 Acute respiratory failure with hypoxia: Secondary | ICD-10-CM | POA: Diagnosis present

## 2021-08-20 DIAGNOSIS — F419 Anxiety disorder, unspecified: Secondary | ICD-10-CM | POA: Diagnosis present

## 2021-08-20 DIAGNOSIS — K861 Other chronic pancreatitis: Secondary | ICD-10-CM | POA: Diagnosis present

## 2021-08-20 DIAGNOSIS — IMO0002 Reserved for concepts with insufficient information to code with codable children: Secondary | ICD-10-CM | POA: Diagnosis present

## 2021-08-20 DIAGNOSIS — R9389 Abnormal findings on diagnostic imaging of other specified body structures: Secondary | ICD-10-CM | POA: Diagnosis present

## 2021-08-20 DIAGNOSIS — Z833 Family history of diabetes mellitus: Secondary | ICD-10-CM

## 2021-08-20 DIAGNOSIS — F17210 Nicotine dependence, cigarettes, uncomplicated: Secondary | ICD-10-CM | POA: Diagnosis present

## 2021-08-20 DIAGNOSIS — Z794 Long term (current) use of insulin: Secondary | ICD-10-CM

## 2021-08-20 DIAGNOSIS — K621 Rectal polyp: Secondary | ICD-10-CM | POA: Diagnosis present

## 2021-08-20 DIAGNOSIS — E0865 Diabetes mellitus due to underlying condition with hyperglycemia: Secondary | ICD-10-CM | POA: Diagnosis present

## 2021-08-20 DIAGNOSIS — K635 Polyp of colon: Secondary | ICD-10-CM | POA: Diagnosis present

## 2021-08-20 DIAGNOSIS — R946 Abnormal results of thyroid function studies: Secondary | ICD-10-CM | POA: Diagnosis present

## 2021-08-20 DIAGNOSIS — I10 Essential (primary) hypertension: Secondary | ICD-10-CM | POA: Diagnosis present

## 2021-08-20 DIAGNOSIS — K863 Pseudocyst of pancreas: Secondary | ICD-10-CM | POA: Diagnosis present

## 2021-08-20 DIAGNOSIS — J9600 Acute respiratory failure, unspecified whether with hypoxia or hypercapnia: Secondary | ICD-10-CM | POA: Diagnosis not present

## 2021-08-20 DIAGNOSIS — K219 Gastro-esophageal reflux disease without esophagitis: Secondary | ICD-10-CM | POA: Diagnosis present

## 2021-08-20 DIAGNOSIS — Z8041 Family history of malignant neoplasm of ovary: Secondary | ICD-10-CM

## 2021-08-20 DIAGNOSIS — E876 Hypokalemia: Secondary | ICD-10-CM | POA: Diagnosis not present

## 2021-08-20 DIAGNOSIS — E872 Acidosis: Secondary | ICD-10-CM | POA: Diagnosis present

## 2021-08-20 DIAGNOSIS — J9383 Other pneumothorax: Secondary | ICD-10-CM | POA: Diagnosis present

## 2021-08-20 DIAGNOSIS — R7989 Other specified abnormal findings of blood chemistry: Secondary | ICD-10-CM

## 2021-08-20 DIAGNOSIS — J9811 Atelectasis: Secondary | ICD-10-CM | POA: Diagnosis present

## 2021-08-20 DIAGNOSIS — E1065 Type 1 diabetes mellitus with hyperglycemia: Secondary | ICD-10-CM | POA: Diagnosis present

## 2021-08-20 DIAGNOSIS — K7460 Unspecified cirrhosis of liver: Secondary | ICD-10-CM | POA: Diagnosis present

## 2021-08-20 DIAGNOSIS — Z9689 Presence of other specified functional implants: Secondary | ICD-10-CM

## 2021-08-20 DIAGNOSIS — Z20822 Contact with and (suspected) exposure to covid-19: Secondary | ICD-10-CM | POA: Diagnosis present

## 2021-08-20 DIAGNOSIS — D1809 Hemangioma of other sites: Secondary | ICD-10-CM | POA: Diagnosis present

## 2021-08-20 DIAGNOSIS — J90 Pleural effusion, not elsewhere classified: Secondary | ICD-10-CM | POA: Diagnosis not present

## 2021-08-20 LAB — COMPREHENSIVE METABOLIC PANEL
ALT: 37 U/L (ref 0–44)
AST: 47 U/L — ABNORMAL HIGH (ref 15–41)
Albumin: 3.2 g/dL — ABNORMAL LOW (ref 3.5–5.0)
Alkaline Phosphatase: 80 U/L (ref 38–126)
Anion gap: 5 (ref 5–15)
BUN: 13 mg/dL (ref 6–20)
CO2: 23 mmol/L (ref 22–32)
Calcium: 9 mg/dL (ref 8.9–10.3)
Chloride: 110 mmol/L (ref 98–111)
Creatinine, Ser: 1.05 mg/dL — ABNORMAL HIGH (ref 0.44–1.00)
GFR, Estimated: >60 mL/min (ref >60)
Glucose, Bld: 216 mg/dL — ABNORMAL HIGH (ref 70–99)
Potassium: 5.2 mmol/L — ABNORMAL HIGH (ref 3.5–5.1)
Sodium: 138 mmol/L (ref 135–145)
Total Bilirubin: 1 mg/dL (ref 0.3–1.2)
Total Protein: 5.7 g/dL — ABNORMAL LOW (ref 6.5–8.1)

## 2021-08-20 LAB — PROTEIN, PLEURAL OR PERITONEAL FLUID: Total protein, fluid: <3 g/dL

## 2021-08-20 LAB — I-STAT BETA HCG BLOOD, ED (MC, WL, AP ONLY): I-stat hCG, quantitative: <5 m[IU]/mL (ref <5)

## 2021-08-20 LAB — CBC
HCT: 33 % — ABNORMAL LOW (ref 36.0–46.0)
Hemoglobin: 11.9 g/dL — ABNORMAL LOW (ref 12.0–15.0)
MCH: 35.3 pg — ABNORMAL HIGH (ref 26.0–34.0)
MCHC: 36.1 g/dL — ABNORMAL HIGH (ref 30.0–36.0)
MCV: 97.9 fL (ref 80.0–100.0)
Platelets: 355 10*3/uL (ref 150–400)
RBC: 3.37 MIL/uL — ABNORMAL LOW (ref 3.87–5.11)
RDW: 13.5 % (ref 11.5–15.5)
WBC: 7.8 10*3/uL (ref 4.0–10.5)
nRBC: 0 % (ref 0.0–0.2)

## 2021-08-20 LAB — BODY FLUID CELL COUNT WITH DIFFERENTIAL
Lymphs, Fluid: 45 %
Monocyte-Macrophage-Serous Fluid: 28 % — ABNORMAL LOW (ref 50–90)
Neutrophil Count, Fluid: 27 % — ABNORMAL HIGH (ref 0–25)
Total Nucleated Cell Count, Fluid: 257 cu mm (ref 0–1000)

## 2021-08-20 LAB — LACTATE DEHYDROGENASE, PLEURAL OR PERITONEAL FLUID: LD, Fluid: 83 U/L — ABNORMAL HIGH (ref 3–23)

## 2021-08-20 LAB — D-DIMER, QUANTITATIVE: D-Dimer, Quant: 3.5 ug/mL-FEU — ABNORMAL HIGH (ref 0.00–0.50)

## 2021-08-20 LAB — GLUCOSE, PLEURAL OR PERITONEAL FLUID: Glucose, Fluid: 159 mg/dL

## 2021-08-20 LAB — LACTATE DEHYDROGENASE: LDH: 245 U/L — ABNORMAL HIGH (ref 98–192)

## 2021-08-20 MED ORDER — SENNOSIDES-DOCUSATE SODIUM 8.6-50 MG PO TABS: 1.0000 | ORAL_TABLET | Freq: Every evening | ORAL | Status: DC | PRN

## 2021-08-20 MED ORDER — IRBESARTAN 150 MG PO TABS
150.0000 mg | ORAL_TABLET | Freq: Every day | ORAL | Status: DC
  Administered 2021-08-21 – 2021-08-24 (×4): 150 mg via ORAL
  Filled 2021-08-20 (×5): qty 1

## 2021-08-20 MED ORDER — INSULIN GLARGINE-YFGN 100 UNIT/ML ~~LOC~~ SOLN
10.0000 [IU] | Freq: Every day | SUBCUTANEOUS | Status: DC
  Administered 2021-08-21: 10 [IU] via SUBCUTANEOUS
  Filled 2021-08-20 (×2): qty 0.1

## 2021-08-20 MED ORDER — NICOTINE 21 MG/24HR TD PT24
21.0000 mg | MEDICATED_PATCH | Freq: Every day | TRANSDERMAL | Status: DC
  Administered 2021-08-21 – 2021-08-24 (×4): 21 mg via TRANSDERMAL
  Filled 2021-08-20 (×5): qty 1

## 2021-08-20 MED ORDER — ACETAMINOPHEN 650 MG RE SUPP: 650.0000 mg | Freq: Four times a day (QID) | RECTAL | Status: DC | PRN

## 2021-08-20 MED ORDER — ACETAMINOPHEN 325 MG PO TABS: 650.0000 mg | ORAL_TABLET | Freq: Four times a day (QID) | ORAL | Status: DC | PRN

## 2021-08-20 MED ORDER — ONDANSETRON HCL 4 MG/2ML IJ SOLN: 4.0000 mg | Freq: Four times a day (QID) | INTRAMUSCULAR | Status: DC | PRN

## 2021-08-20 MED ORDER — IOHEXOL 350 MG/ML SOLN
69.0000 mL | Freq: Once | INTRAVENOUS | Status: AC | PRN
  Administered 2021-08-20: 69 mL via INTRAVENOUS

## 2021-08-20 MED ORDER — INSULIN DEGLUDEC 100 UNIT/ML ~~LOC~~ SOPN: 12.0000 [IU] | PEN_INJECTOR | Freq: Every day | SUBCUTANEOUS | Status: DC

## 2021-08-20 MED ORDER — ENOXAPARIN SODIUM 40 MG/0.4ML IJ SOSY: 40.0000 mg | PREFILLED_SYRINGE | INTRAMUSCULAR | Status: DC

## 2021-08-20 MED ORDER — ONDANSETRON HCL 4 MG PO TABS: 4.0000 mg | ORAL_TABLET | Freq: Four times a day (QID) | ORAL | Status: DC | PRN

## 2021-08-20 MED ORDER — INSULIN ASPART 100 UNIT/ML IJ SOLN
0.0000 [IU] | Freq: Three times a day (TID) | INTRAMUSCULAR | Status: DC
  Administered 2021-08-21 (×2): 2 [IU] via SUBCUTANEOUS
  Administered 2021-08-21 – 2021-08-22 (×2): 3 [IU] via SUBCUTANEOUS
  Administered 2021-08-22: 4 [IU] via SUBCUTANEOUS
  Administered 2021-08-24: 2 [IU] via SUBCUTANEOUS

## 2021-08-20 MED ORDER — INSULIN ASPART 100 UNIT/ML IJ SOLN
0.0000 [IU] | Freq: Every day | INTRAMUSCULAR | Status: DC
  Administered 2021-08-22: 3 [IU] via SUBCUTANEOUS

## 2021-08-20 NOTE — ED Notes (Signed)
Called microbiology to add on cytology to pleural fluid that was collected via thoracentesis.
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
## 2021-08-20 NOTE — ED Notes (Signed)
Pt O2 saturation at 95% on RA. Pt placed on 2L [place] for comfort, stating that she felt like she was "gasping for breath" w/o it. RN, Zoe, notified.

## 2021-08-20 NOTE — ED Triage Notes (Signed)
Pt comes from  urgent care via EMS. EMS called out for SOB increasing over last week. X-ray at urgent care showed fluid in right lung. No hx of same, no hx of CPOD. Pt on simple mask at 8L. Pt not on O2 at home. Pt denies any cp or pain otherwise. Pt a/o x4. BP [id number] HR 78 RR 22 O2 100% on 8L simple mask BGL 291 (pt just ate and did not have time to take insulin per pt)

## 2021-08-20 NOTE — ED Notes (Signed)
Procedure started with provider Tomi Bamberger at bedside. V.S.- 100% (4L), 61-HR, 15-RR

## 2021-08-20 NOTE — ED Notes (Signed)
ED Provider at bedside. Explaining procedure to pt. Consent will be signed

## 2021-08-20 NOTE — H&P (Signed)
History and Physical    Suzanne Weeks EEF:[id number] DOB: [birth year] DOA: 08/20/2021  PCP: Suzanne Jews, PA-C  Patient coming from: Home via urgent care  I have personally briefly reviewed patient's old medical records in [place]  Chief Complaint: Shortness of breath  HPI: Suzanne Weeks is a 40 y.o. 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  Patient reports 2 weeks ago developing clear rhinorrhea with mild shortness of breath.  She thought she was developing a URI and also did an at-home COVID test which was negative.  She says that runny nose resolved with supportive care after short period of time however her shortness of breath persisted.  She says shortness of breath was only occurring with exertion and deep inspiration and has been progressive since onset.  She had associated dry cough which occurred only with deep breaths.  Due to persistent symptoms she went to urgent care today where an x-ray was performed and found to have a large right pleural effusion.  She was subsequently sent to the ED for further evaluation.  Patient seen after thoracentesis performed.  She reports significant improvement in her dyspnea.  She denies any associated subjective fevers, chills, diaphoresis, nausea, vomiting, chest pain, abdominal pain, dysuria, diarrhea, or lower extremity edema.  He does report current tobacco use of up to half a pack per day.  ED Course:  Initial vitals showed BP 167/111, pulse 73, RR 19, temp 98.1 F, SPO2 100% on 2 L supplemental O2 via [place].  Labs showed WBC 7.8, hemoglobin 11.9, platelets were 355,000, sodium 138, potassium 5.2, bicarb 23, BUN 13, creatinine 1.05, serum glucose 216, total protein 5.7, albumin 3.2, AST 47, ALT 37, alk phos  80, total bilirubin 1.0, LDH 245, D-dimer 3.50, i-STAT beta-hCG <5.0.  Portable chest x-ray showed very large right pleural effusion with resulting subtotal right lung collapse and mediastinal shift to the left.  CTA chest PE study negative for acute PE.  Large right-sided pleural effusion with atelectasis of right lung and shift mediastinal contact the left again noted.  Narrowed appearance of right upper and lower lobe bronchi seen.  Findings consistent with chronic pancreatitis noted.  EDP performed thoracentesis with approximately 1 L serosanguineous fluid removed.  Pleural fluid was sent for labs.  Follow-up chest x-ray showed persistent large residual right pleural effusion without pneumothorax.  The hospitalist service was consulted to admit for further evaluation and management.  Review of Systems: All systems reviewed and are negative except as documented in history of present illness above.   Past Medical History:  Diagnosis Date   Essential hypertension 07/01/2020   GERD without esophagitis 07/01/2020   Hypertension    Idiopathic chronic pancreatitis (HCC)    Nicotine dependence, cigarettes, uncomplicated 8/83/2549   Pseudocyst of pancreas    Uncontrolled diabetes mellitus secondary to pancreatic insufficiency ([place]) 03/2020   chronic pancreatitis with pseudocyst formation   Uterine fibroid     Past Surgical History:  Procedure Laterality Date   ABDOMINAL HYSTERECTOMY      Social History:  reports that she has been smoking cigarettes. She has been smoking an average of .5 packs per day. She has never used smokeless tobacco. She reports that she does not currently use alcohol. No history on file for drug use.  No Known Allergies  Family History  Problem  Relation Age of Onset   Diabetes Father      Prior to Admission medications   Medication Sig Start Date End Date Taking? Authorizing Provider  ascorbic acid (VITAMIN C) 500 MG tablet Take 1,000 mg by mouth daily.     [provider]  famotidine (PEPCID) 20 MG tablet Take 1 tablet (20 mg total) by mouth 2 (two) times daily. Patient not taking: Reported on 07/01/2020 04/20/16   Suzanne Puls, MD  insulin aspart (NOVOLOG FLEXPEN) 100 UNIT/ML FlexPen Inject 4 Units into the skin 3 (three) times daily before meals.    [provider]  insulin degludec (TRESIBA) 100 UNIT/ML FlexTouch Pen Inject 0.1 mLs (10 Units total) into the skin daily. 07/03/20   Suzanne Cousins, MD  lisinopril (PRINIVIL,ZESTRIL) 10 MG tablet Take 10 mg by mouth daily. Patient not taking: Reported on 07/01/2020    [provider]  metFORMIN (GLUCOPHAGE) 1000 MG tablet Take 1,000 mg by mouth 2 (two) times daily. 06/02/20   [provider]  Multiple Vitamin (MULTIVITAMIN WITH MINERALS) TABS tablet Take 1 tablet by mouth daily with supper.     [provider]  naproxen sodium (ALEVE) 220 MG tablet Take 220 mg by mouth daily as needed (pain).    [provider]  omeprazole (PRILOSEC) 20 MG capsule Take 20 mg by mouth daily as needed (nausea/acid reflux).  05/19/20   [provider]  vitamin B-12 (CYANOCOBALAMIN) 500 MCG tablet Take 1 tablet (500 mcg total) by mouth daily. 07/03/20   Suzanne Cousins, MD    Physical Exam: Vitals:   08/20/21 2014 08/20/21 2015 08/20/21 2100 08/20/21 2130  BP: (!) 174/113 (!) 174/113 (!) 154/106 (!) 169/95  Pulse: 73 78 81 64  Resp: 19 20 16 19   Temp: 98.3 F (36.8 C)     TempSrc: Oral     SpO2: 97% 97% 99% 100%  Weight:      Height:       Constitutional: Sitting up in bed, NAD, calm, comfortable Eyes: PERRL, lids and conjunctivae normal ENMT: Mucous membranes are moist. Posterior pharynx clear of any exudate or lesions.Normal dentition.  Neck: normal, supple, no masses. Respiratory: Absent breath sounds right lung field, clear to auscultation on the left. Normal respiratory effort. No accessory muscle use.  Cardiovascular: Regular rate and  rhythm, no murmurs / rubs / gallops. No extremity edema. 2+ pedal pulses. Abdomen: no tenderness, no masses palpated. No hepatosplenomegaly. Bowel sounds positive.  Musculoskeletal: no clubbing / cyanosis. No joint deformity upper and lower extremities. Good ROM, no contractures. Normal muscle tone.  Skin: no rashes, lesions, ulcers. No induration Neurologic: CN 2-12 grossly intact. Sensation intact. Strength 5/5 in all 4.  Psychiatric: Normal judgment and insight. Alert and oriented x 3. Normal mood.   Labs on Admission: I have personally reviewed following labs and imaging studies  CBC: Recent Labs  Lab 08/20/21 1634  WBC 7.8  HGB 11.9*  HCT 33.0*  MCV 97.9  PLT 729   Basic Metabolic Panel: Recent Labs  Lab 08/20/21 1634  NA 138  K 5.2*  CL 110  CO2 23  GLUCOSE 216*  BUN 13  CREATININE 1.05*  CALCIUM 9.0   GFR: Estimated Creatinine Clearance: 63.4 mL/min (A) (by C-G formula based on SCr of 1.05 mg/dL (H)). Liver Function Tests: Recent Labs  Lab 08/20/21 1634  AST 47*  ALT 37  ALKPHOS 80  BILITOT 1.0  PROT 5.7*  ALBUMIN 3.2*   No results for input(s):  LIPASE, AMYLASE in the last 168 hours. No results for input(s): AMMONIA in the last 168 hours. Coagulation Profile: No results for input(s): INR, PROTIME in the last 168 hours. Cardiac Enzymes: No results for input(s): CKTOTAL, CKMB, CKMBINDEX, TROPONINI in the last 168 hours. BNP (last 3 results) No results for input(s): PROBNP in the last 8760 hours. HbA1C: No results for input(s): HGBA1C in the last 72 hours. CBG: No results for input(s): GLUCAP in the last 168 hours. Lipid Profile: No results for input(s): CHOL, HDL, LDLCALC, TRIG, CHOLHDL, LDLDIRECT in the last 72 hours. Thyroid Function Tests: No results for input(s): TSH, T4TOTAL, FREET4, T3FREE, THYROIDAB in the last 72 hours. Anemia Panel: No results for input(s): VITAMINB12, FOLATE, FERRITIN, TIBC, IRON, RETICCTPCT in the last 72 hours. Urine  analysis:    Component Value Date/Time   COLORURINE YELLOW 04/20/2016 1810   APPEARANCEUR CLOUDY (A) 04/20/2016 1810   LABSPEC 1.017 04/20/2016 1810   PHURINE 7.0 04/20/2016 1810   GLUCOSEU NEGATIVE 04/20/2016 1810   HGBUR NEGATIVE 04/20/2016 1810   BILIRUBINUR NEGATIVE 04/20/2016 1810   KETONESUR 40 (A) 04/20/2016 1810   PROTEINUR NEGATIVE 04/20/2016 1810   NITRITE NEGATIVE 04/20/2016 1810   LEUKOCYTESUR NEGATIVE 04/20/2016 1810    Radiological Exams on Admission: CT Angio Chest PE W and/or Wo Contrast  Result Date: 08/20/2021 CLINICAL DATA:  Shortness of breath EXAM: CT ANGIOGRAPHY CHEST WITH CONTRAST TECHNIQUE: Multidetector CT imaging of the chest was performed using the standard protocol during bolus administration of intravenous contrast. Multiplanar CT image reconstructions and MIPs were obtained to evaluate the vascular anatomy. CONTRAST:  80m OMNIPAQUE IOHEXOL 350 MG/ML SOLN COMPARISON:  Chest x-ray 08/20/2021 FINDINGS: Cardiovascular: Satisfactory opacification of the pulmonary arteries to the segmental level. No evidence of pulmonary embolism. Normal cardiac size. Trace pericardial effusion. Mediastinum/Nodes: Shift of mediastinal contents to the left. Leftward deviation of trachea and esophagus. No grossly enlarged mediastinal lymph nodes. No thyroid mass Lungs/Pleura: Large left-sided right pleural effusion with atelectasis of the right lung. Small amount of aerated right upper lobe. Narrowed appearing right upper and lower lobe bronchi. Upper Abdomen: Numerous pancreatic calcifications consistent with chronic pancreatitis. No acute abnormality Musculoskeletal: No chest wall abnormality. No acute or significant osseous findings. Review of the MIP images confirms the above findings. IMPRESSION: 1. Negative for acute pulmonary embolus. 2. Large right-sided pleural effusion with atelectasis of right lung and shift of mediastinal contents to the left. Narrowed appearance of right upper  and lower lobe bronchi. 3. Findings consistent with chronic pancreatitis Electronically Signed   By: KDonavan FoilM.D.   On: 08/20/2021 19:23   DG Chest Portable 1 View  Result Date: 08/20/2021 CLINICAL DATA:  Shortness of breath.  Pleural effusion. EXAM: PORTABLE CHEST 1 VIEW COMPARISON:  Chest radiographs 07/01/2020. No recent prior studies available. FINDINGS: 1655 hours. There is a very large right pleural effusion with near complete opacification of the right hemithorax and mild mediastinal shift to the left. The heart size appears stable. The left lung is clear. There is no pneumothorax. The bones appear unchanged. Telemetry leads overlie the chest. There are calcifications in the left upper quadrant of the abdomen suggesting chronic calcific pancreatitis. This was seen on prior abdominal CT 04/24/2020. IMPRESSION: Very large right pleural effusion of with resulting subtotal right lung collapse and mediastinal shift to the left. Electronically Signed   By: WRichardean SaleM.D.   On: 08/20/2021 17:16    EKG: Personally reviewed. Normal sinus rhythm without acute ischemic changes.  Similar  to prior.  Assessment/Plan Principal Problem:   Acute respiratory failure (HCC) Active Problems:   Idiopathic chronic pancreatitis ([place])   Essential hypertension   Uncontrolled diabetes mellitus secondary to pancreatic insufficiency (HCC)   Pleural effusion on right   Melvie Paglia is a 40 y.o. 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  Acute respiratory failure due to large right pleural effusion: Persistent large right pleural effusion s/p thoracentesis performed in ED 08/20/2021 with approximately 1 L serosanguineous fluid removed.  Symptomatically improved and  saturating well on room air.  Labs obtained.  Suspect related to chronic pancreatitis. -Follow pleural fluid labs -Supplemental oxygen as needed, incentive spirometer -Use Lasix if needed for worsening dyspnea  Insulin-dependent diabetes: Continue home Tresiba 12 units daily.  Add SSI.  Chronic pancreatitis with history of pseudocyst: Denies any abdominal pain.  Hypertension: Continue irbesartan.  Tobacco use: Smoking up to half pack per day.  Nicotine patch ordered.  Smoking cessation advised.  DVT prophylaxis: Lovenox Code Status: Full code, confirmed with patient Family Communication: Discussed with patient, she has discussed with close acquaintance Disposition Plan: From home and likely return home pending clinical progress Consults called: None Level of care: Med-Surg Admission status:  Status is: Observation  The patient remains OBS appropriate and will d/c before 2 midnights.  Dispo: The patient is from: Home              Anticipated d/c is to: Home              Patient currently is not medically stable to d/c.   Difficult to place patient No  Zada Finders MD [facility]  If 7PM-7AM, please contact night-coverage [email or web address]  08/20/2021, 9:54 PM

## 2021-08-21 ENCOUNTER — Observation Stay (HOSPITAL_COMMUNITY): Payer: BC Managed Care – PPO

## 2021-08-21 DIAGNOSIS — E0865 Diabetes mellitus due to underlying condition with hyperglycemia: Secondary | ICD-10-CM

## 2021-08-21 DIAGNOSIS — J90 Pleural effusion, not elsewhere classified: Secondary | ICD-10-CM | POA: Diagnosis not present

## 2021-08-21 DIAGNOSIS — R7989 Other specified abnormal findings of blood chemistry: Secondary | ICD-10-CM

## 2021-08-21 DIAGNOSIS — K861 Other chronic pancreatitis: Secondary | ICD-10-CM | POA: Diagnosis not present

## 2021-08-21 DIAGNOSIS — I10 Essential (primary) hypertension: Secondary | ICD-10-CM | POA: Diagnosis not present

## 2021-08-21 DIAGNOSIS — K8689 Other specified diseases of pancreas: Secondary | ICD-10-CM

## 2021-08-21 DIAGNOSIS — E876 Hypokalemia: Secondary | ICD-10-CM

## 2021-08-21 DIAGNOSIS — J9600 Acute respiratory failure, unspecified whether with hypoxia or hypercapnia: Secondary | ICD-10-CM | POA: Diagnosis not present

## 2021-08-21 LAB — AMYLASE: Amylase: 301 U/L — ABNORMAL HIGH (ref 28–100)

## 2021-08-21 LAB — BASIC METABOLIC PANEL
Anion gap: 5 (ref 5–15)
BUN: 10 mg/dL (ref 6–20)
CO2: 22 mmol/L (ref 22–32)
Calcium: 8.9 mg/dL (ref 8.9–10.3)
Chloride: 109 mmol/L (ref 98–111)
Creatinine, Ser: 1 mg/dL (ref 0.44–1.00)
GFR, Estimated: >60 mL/min (ref >60)
Glucose, Bld: 207 mg/dL — ABNORMAL HIGH (ref 70–99)
Potassium: 3.4 mmol/L — ABNORMAL LOW (ref 3.5–5.1)
Sodium: 136 mmol/L (ref 135–145)

## 2021-08-21 LAB — GLUCOSE, CAPILLARY: Glucose-Capillary: 392 mg/dL — ABNORMAL HIGH (ref 70–99)

## 2021-08-21 LAB — HEMOGLOBIN A1C
Hgb A1c MFr Bld: 5.1 % (ref 4.8–5.6)
Mean Plasma Glucose: 99.67 mg/dL

## 2021-08-21 LAB — CBG MONITORING, ED: Glucose-Capillary: 180 mg/dL — ABNORMAL HIGH (ref 70–99)

## 2021-08-21 LAB — AMYLASE, PLEURAL OR PERITONEAL FLUID: Amylase, Fluid: 7141 U/L

## 2021-08-21 LAB — HIV ANTIBODY (ROUTINE TESTING W REFLEX): HIV Screen 4th Generation wRfx: NONREACTIVE

## 2021-08-21 LAB — RESP PANEL BY RT-PCR (FLU A&B, COVID) ARPGX2
Influenza A by PCR: NEGATIVE
Influenza B by PCR: NEGATIVE
SARS Coronavirus 2 by RT PCR: NEGATIVE

## 2021-08-21 LAB — T4, FREE: Free T4: 1.11 ng/dL (ref 0.61–1.12)

## 2021-08-21 LAB — TSH: TSH: 7.865 u[IU]/mL — ABNORMAL HIGH (ref 0.350–4.500)

## 2021-08-21 MED ORDER — ACETAMINOPHEN 500 MG PO TABS: 1000.0000 mg | ORAL_TABLET | Freq: Two times a day (BID) | ORAL | Status: DC

## 2021-08-21 MED ORDER — INSULIN DEGLUDEC 100 UNIT/ML ~~LOC~~ SOPN
12.0000 [IU] | PEN_INJECTOR | Freq: Every day | SUBCUTANEOUS | Status: DC
  Administered 2021-08-22 – 2021-08-24 (×3): 12 [IU] via SUBCUTANEOUS
  Filled 2021-08-21 (×4): qty 3

## 2021-08-21 MED ORDER — AMLODIPINE BESYLATE 5 MG PO TABS
5.0000 mg | ORAL_TABLET | Freq: Every day | ORAL | Status: DC
  Administered 2021-08-21 – 2021-08-24 (×4): 5 mg via ORAL
  Filled 2021-08-21 (×4): qty 1

## 2021-08-21 MED ORDER — POTASSIUM CHLORIDE CRYS ER 20 MEQ PO TBCR
20.0000 meq | EXTENDED_RELEASE_TABLET | Freq: Once | ORAL | Status: AC
  Administered 2021-08-21: 20 meq via ORAL
  Filled 2021-08-21: qty 1
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
## 2021-08-21 NOTE — ED Notes (Signed)
Pt is back from ultrasound.

## 2021-08-21 NOTE — ED Notes (Signed)
Patient's CGM results:  10pm: 152 Patient reports eating around 10:30pm 11:01: 141  Midnight: 208 2am glucose: 261

## 2021-08-21 NOTE — ED Notes (Signed)
Pt ambulatory to bathroom

## 2021-08-21 NOTE — Progress Notes (Signed)
New order received for Tresiba insulin and explained that hospital Novolog will be used instead of her pen. Patient clarified understanding

## 2021-08-21 NOTE — ED Notes (Signed)
Patient transported to Ultrasound 

## 2021-08-21 NOTE — Progress Notes (Signed)
PROGRESS NOTE    Suzanne Weeks  [id number] DOB: 03/27/1981 DOA: 08/20/2021 PCP: Curt Jews, PA-C (Confirm with patient/family/NH records and if not entered, this HAS to be entered at [facility] point of entry. 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  Work-up done concerning for large right pleural effusion.  CT angiogram chest done negative for PE however showed a large right-sided pleural effusion with atelectasis of the right lung and mediastinal shift.  EDP performed ultrasound guided diagnostic and therapeutic thoracentesis with approximately 1 L of serosanguineous fluid removed.  Follow-up chest x-ray showed persistent large residual right pleural effusion without a pneumothorax.   Assessment & Plan:   Principal Problem:   Acute respiratory failure (HCC) Active Problems:   Idiopathic chronic pancreatitis ([place])   Essential hypertension   Uncontrolled diabetes mellitus secondary to pancreatic insufficiency (HCC)   Pleural effusion on right   #1 acute respiratory failure secondary to large right transudative pleural effusion with mediastinal shift -Patient presented worsening shortness of breath.  Chest x-ray done consistent with a large right-sided pleural effusion.  CT angiogram chest done negative for PE however showed a large right-sided pleural effusion with atelectasis of the right lung and mediastinal shift to the left, narrowed appearance of right upper lower lobe bronchi, findings consistent with chronic pancreatitis. -Body fluids with amylase level of 7141, glucose of 159, LDH 83, few mesothelial cells.  Pleural fluid cultures negative. -Etiology could be  secondary to chronic pancreatitis versus GI source. -Abdominal ultrasound with no cholelithiasis identified, borderline enlargement of CBD with no obstructing lesion identified, echogenic lesion in the liver measuring up to 1.3 cm unchanged since 2017 likely benign lesion such as hemangioma, right pleural effusion. -Post thoracentesis chest x-ray with with large residual right pleural fluid, no pneumothorax. -Due to large residual right pleural fluid despite thoracentesis we will consult with PCCM for further evaluation and management. -May need chest tube placement. -Follow.  2.  Insulin-dependent diabetes mellitus -Hemoglobin A1c 5.1 -CBG 180 this morning. -Continues Semglee 10 units daily, SSI.  3.  Hypertension -Continue Avapro. -Start Norvasc 5 mg daily and uptitrate as needed for better blood pressure control.  4.  Tobacco abuse -Tobacco cessation -Nicotine patch.  5.  Chronic pancreatitis with history of pseudocyst -Currently asymptomatic. -Outpatient follow-up.  6.  Hypokalemia -K-Dur 20 mEq p.o. x1  7.  Abnormal TSH -TSH at 7.865. -Free T4 within normal limits at 1.11 -Will need repeat thyroid function studies done in the outpatient setting in 6 to 8 weeks.   DVT prophylaxis: scd Code Status: Full Family Communication: Updated patient.  No family at bedside Disposition:   Status is: Observation  The patient remains OBS appropriate and will d/c before 2 midnights.  Dispo: The patient is from: Home              Anticipated d/c is to: Home              Patient currently is not medically stable to d/c.   Difficult to place patient No       Consultants:  PCCM pending  Procedures:  Ultrasound-guided thoracentesis by EDP 08/20/2021 CT angiogram chest 08/20/2021 Chest x-ray 08/20/2021 Abdominal ultrasound 08/21/2021    Antimicrobials:  None   Subjective: Patient sitting on gurney in the ED denies any chest pain.  Feels shortness of breath is improved  significantly after thoracentesis.  Denies any significant exertional shortness of breath.  Anxious to go home.  Objective: Vitals:   08/21/21 0900 08/21/21 1015 08/21/21 1100 08/21/21 1130  BP: (!) 152/105 (!) 148/113 (!) 163/109 (!) 171/109  Pulse: 82 87 85 87  Resp: (!) '21 19 17 20  '$ Temp:      TempSrc:      SpO2: 96% 97% 98% 99%  Weight:      Height:       No intake or output data in the 24 hours ending 08/21/21 1255 Filed Weights   08/20/21 2012  Weight: 57.2 kg    Examination:  General exam: Appears calm and comfortable  Respiratory system: Absent breath sounds in the right base and right middle lobe.  Fair amount of movement on the left.  No wheezing.  No crackles.  Speaking in full sentences.  Normal respiratory effort. Cardiovascular system: S1 & S2 heard, RRR. No JVD, murmurs, rubs, gallops or clicks. No pedal edema. Gastrointestinal system: Abdomen is nondistended, soft and nontender. No organomegaly or masses felt. Normal bowel sounds heard. Central nervous system: Alert and oriented. No focal neurological deficits. Extremities: Symmetric 5 x 5 power. Skin: No rashes, lesions or ulcers Psychiatry: Judgement and insight appear normal. Mood & affect appropriate.     Data Reviewed: I have personally reviewed following labs and imaging studies  CBC: Recent Labs  Lab 08/20/21 1634  WBC 7.8  HGB 11.9*  HCT 33.0*  MCV 97.9  PLT [id number]    Basic Metabolic Panel: Recent Labs  Lab 08/20/21 1634 08/21/21 0500  NA 138 136  K 5.2* 3.4*  CL 110 109  CO2 23 22  GLUCOSE 216* 207*  BUN 13 10  CREATININE 1.05* 1.00  CALCIUM 9.0 8.9    GFR: Estimated Creatinine Clearance: 66.6 mL/min (by C-G formula based on SCr of 1 mg/dL).  Liver Function Tests: Recent Labs  Lab 08/20/21 1634  AST 47*  ALT 37  ALKPHOS 80  BILITOT 1.0  PROT 5.7*  ALBUMIN 3.2*    CBG: Recent Labs  Lab 08/21/21 0742  GLUCAP 180*     Recent Results (from the past 240 hour(s))   Pleural fluid culture w Gram Stain     Status: None (Preliminary result)   Collection Time: 08/20/21  9:06 PM   Specimen: Pleural Fluid  Result Value Ref Range Status   Specimen Description PLEURAL  Final   Special Requests NONE  Final   Gram Stain   Final    RARE WBC PRESENT, PREDOMINANTLY MONONUCLEAR NO ORGANISMS SEEN    Culture   Final    NO GROWTH < 12 HOURS Performed at [facility], [place] [address], [place], [place] [address]    Report Status PENDING  Incomplete         Radiology Studies: CT Angio Chest PE W and/or Wo Contrast  Result Date: 08/20/2021 CLINICAL DATA:  Shortness of breath EXAM: CT ANGIOGRAPHY CHEST WITH CONTRAST TECHNIQUE: Multidetector CT imaging of the chest was performed using the standard protocol during bolus administration of intravenous contrast. Multiplanar CT image reconstructions and MIPs were obtained to evaluate the vascular anatomy. CONTRAST:  31m OMNIPAQUE IOHEXOL 350 MG/ML SOLN COMPARISON:  Chest x-ray 08/20/2021 FINDINGS: Cardiovascular: Satisfactory opacification of the pulmonary arteries to the segmental level. No evidence of pulmonary embolism. Normal cardiac size. Trace  pericardial effusion. Mediastinum/Nodes: Shift of mediastinal contents to the left. Leftward deviation of trachea and esophagus. No grossly enlarged mediastinal lymph nodes. No thyroid mass Lungs/Pleura: Large left-sided right pleural effusion with atelectasis of the right lung. Small amount of aerated right upper lobe. Narrowed appearing right upper and lower lobe bronchi. Upper Abdomen: Numerous pancreatic calcifications consistent with chronic pancreatitis. No acute abnormality Musculoskeletal: No chest wall abnormality. No acute or significant osseous findings. Review of the MIP images confirms the above findings. IMPRESSION: 1. Negative for acute pulmonary embolus. 2. Large right-sided pleural effusion with atelectasis of right lung and shift of mediastinal contents to the  left. Narrowed appearance of right upper and lower lobe bronchi. 3. Findings consistent with chronic pancreatitis Electronically Signed   By: Donavan Foil M.D.   On: 08/20/2021 19:23   DG Chest Portable 1 View  Result Date: 08/20/2021 CLINICAL DATA:  Status post right thoracentesis EXAM: PORTABLE CHEST 1 VIEW COMPARISON:  4:55 p.m. FINDINGS: Large right pleural effusion persists with collapse of the right middle and lower lobes and compressive atelectasis of the right upper lobe, slightly improved since prior examination. Left lung is clear. No pneumothorax. No pleural effusion on the left. Cardiac size within normal limits. IMPRESSION: Status post right thoracentesis with large residual right pleural fluid. No pneumothorax. Electronically Signed   By: Fidela Salisbury M.D.   On: 08/20/2021 21:58   DG Chest Portable 1 View  Result Date: 08/20/2021 CLINICAL DATA:  Shortness of breath.  Pleural effusion. EXAM: PORTABLE CHEST 1 VIEW COMPARISON:  Chest radiographs 07/01/2020. No recent prior studies available. FINDINGS: 1655 hours. There is a very large right pleural effusion with near complete opacification of the right hemithorax and mild mediastinal shift to the left. The heart size appears stable. The left lung is clear. There is no pneumothorax. The bones appear unchanged. Telemetry leads overlie the chest. There are calcifications in the left upper quadrant of the abdomen suggesting chronic calcific pancreatitis. This was seen on prior abdominal CT 04/24/2020. IMPRESSION: Very large right pleural effusion of with resulting subtotal right lung collapse and mediastinal shift to the left. Electronically Signed   By: Richardean Sale M.D.   On: 08/20/2021 17:16   US Abdomen Limited RUQ (LIVER/GB)  Result Date: 08/21/2021 CLINICAL DATA:  Pancreatitis EXAM: ULTRASOUND ABDOMEN LIMITED RIGHT UPPER QUADRANT COMPARISON:  CTA chest 08/20/2021, CT abdomen/pelvis 04/29/2013 abdominal ultrasound 04/20/2016 FINDINGS:  Gallbladder: No gallstones or wall thickening visualized. No sonographic Murphy sign noted by sonographer. Common bile duct: Diameter: 6 mm Liver: There is an echogenic area in the right hepatic lobe measuring 0.7 cm x 1.4 cm x 0.7 cm. Parenchymal echogenicity is otherwise normal. Portal vein is patent on color Doppler imaging with normal direction of blood flow towards the liver. Other: There is a right pleural effusion. IMPRESSION: 1. No cholelithiasis identified. Borderline enlargement of the common bile duct with no obstructing lesion identified. 2. Echogenic lesion in the liver measuring up to 1.3 cm is unchanged since 2017. Given stability, this likely reflects a benign lesion such as a hemangioma. 3. Right pleural effusion. Electronically Signed   By: Valetta Mole M.D.   On: 08/21/2021 10:22        Scheduled Meds:  enoxaparin (LOVENOX) injection  40 mg Subcutaneous Q24H   insulin aspart  0-5 Units Subcutaneous QHS   insulin aspart  0-9 Units Subcutaneous TID WC   insulin glargine-yfgn  10 Units Subcutaneous Daily   irbesartan  150 mg Oral Daily  nicotine  21 mg Transdermal Daily   Continuous Infusions:   LOS: 0 days    Time spent: 40 minutes    Irine Seal, MD [facility]   To contact the attending provider between 7A-7P or the covering provider during after hours 7P-7A, please log into the web site [email or web address] and access using universal [place] password for that web site. If you do not have the password, please call the hospital operator.  08/21/2021, 12:55 PM

## 2021-08-21 NOTE — Progress Notes (Signed)
[facility] paged informed that patient has her own insulin Degludec and is requesting to use hers. Arthor Captain LPN

## 2021-08-21 NOTE — ED Notes (Signed)
Pt used Dexcom to check sugar. Pts sugar is 237

## 2021-08-22 ENCOUNTER — Inpatient Hospital Stay (HOSPITAL_COMMUNITY): Payer: BC Managed Care – PPO

## 2021-08-22 ENCOUNTER — Encounter (HOSPITAL_COMMUNITY)
Admission: EM | Disposition: A | Discharge status: Home / Self Care | Payer: Self-pay | Source: Home / Self Care | Attending: Internal Medicine

## 2021-08-22 ENCOUNTER — Observation Stay (HOSPITAL_COMMUNITY): Payer: BC Managed Care – PPO

## 2021-08-22 DIAGNOSIS — E1065 Type 1 diabetes mellitus with hyperglycemia: Secondary | ICD-10-CM | POA: Diagnosis present

## 2021-08-22 DIAGNOSIS — K7460 Unspecified cirrhosis of liver: Secondary | ICD-10-CM | POA: Diagnosis present

## 2021-08-22 DIAGNOSIS — D1809 Hemangioma of other sites: Secondary | ICD-10-CM | POA: Diagnosis present

## 2021-08-22 DIAGNOSIS — R9389 Abnormal findings on diagnostic imaging of other specified body structures: Secondary | ICD-10-CM | POA: Diagnosis present

## 2021-08-22 DIAGNOSIS — Z833 Family history of diabetes mellitus: Secondary | ICD-10-CM | POA: Diagnosis not present

## 2021-08-22 DIAGNOSIS — R946 Abnormal results of thyroid function studies: Secondary | ICD-10-CM | POA: Diagnosis present

## 2021-08-22 DIAGNOSIS — K635 Polyp of colon: Secondary | ICD-10-CM | POA: Diagnosis present

## 2021-08-22 DIAGNOSIS — K621 Rectal polyp: Secondary | ICD-10-CM | POA: Diagnosis present

## 2021-08-22 DIAGNOSIS — J9601 Acute respiratory failure with hypoxia: Secondary | ICD-10-CM | POA: Diagnosis present

## 2021-08-22 DIAGNOSIS — J90 Pleural effusion, not elsewhere classified: Secondary | ICD-10-CM | POA: Diagnosis present

## 2021-08-22 DIAGNOSIS — Z8041 Family history of malignant neoplasm of ovary: Secondary | ICD-10-CM | POA: Diagnosis not present

## 2021-08-22 DIAGNOSIS — J9383 Other pneumothorax: Secondary | ICD-10-CM | POA: Diagnosis present

## 2021-08-22 DIAGNOSIS — Z794 Long term (current) use of insulin: Secondary | ICD-10-CM | POA: Diagnosis not present

## 2021-08-22 DIAGNOSIS — F419 Anxiety disorder, unspecified: Secondary | ICD-10-CM | POA: Diagnosis present

## 2021-08-22 DIAGNOSIS — K863 Pseudocyst of pancreas: Secondary | ICD-10-CM | POA: Diagnosis present

## 2021-08-22 DIAGNOSIS — I10 Essential (primary) hypertension: Secondary | ICD-10-CM | POA: Diagnosis present

## 2021-08-22 DIAGNOSIS — F17210 Nicotine dependence, cigarettes, uncomplicated: Secondary | ICD-10-CM | POA: Diagnosis present

## 2021-08-22 DIAGNOSIS — E876 Hypokalemia: Secondary | ICD-10-CM | POA: Diagnosis not present

## 2021-08-22 DIAGNOSIS — K219 Gastro-esophageal reflux disease without esophagitis: Secondary | ICD-10-CM | POA: Diagnosis present

## 2021-08-22 DIAGNOSIS — J9811 Atelectasis: Secondary | ICD-10-CM | POA: Diagnosis present

## 2021-08-22 DIAGNOSIS — K861 Other chronic pancreatitis: Secondary | ICD-10-CM | POA: Diagnosis present

## 2021-08-22 DIAGNOSIS — J9600 Acute respiratory failure, unspecified whether with hypoxia or hypercapnia: Secondary | ICD-10-CM | POA: Diagnosis present

## 2021-08-22 DIAGNOSIS — E872 Acidosis: Secondary | ICD-10-CM | POA: Diagnosis present

## 2021-08-22 DIAGNOSIS — Z20822 Contact with and (suspected) exposure to covid-19: Secondary | ICD-10-CM | POA: Diagnosis present

## 2021-08-22 LAB — PH, BODY FLUID: pH, Body Fluid: 7.8

## 2021-08-22 LAB — TRIGLYCERIDES, BODY FLUIDS: Triglycerides, Fluid: 29 mg/dL

## 2021-08-22 SURGERY — THORACENTESIS

## 2021-08-22 MED ORDER — IOHEXOL 350 MG/ML SOLN
80.0000 mL | Freq: Once | INTRAVENOUS | Status: AC | PRN
  Administered 2021-08-22: 80 mL via INTRAVENOUS

## 2021-08-22 MED ORDER — OXYCODONE-ACETAMINOPHEN 5-325 MG PO TABS
1.0000 | ORAL_TABLET | Freq: Four times a day (QID) | ORAL | Status: DC | PRN
  Administered 2021-08-22 – 2021-08-24 (×3): 1 via ORAL
  Filled 2021-08-22 (×4): qty 1

## 2021-08-22 MED ORDER — IOHEXOL 9 MG/ML PO SOLN
ORAL | Status: AC
  Filled 2021-08-22: qty 1000

## 2021-08-22 MED ORDER — KETOROLAC TROMETHAMINE 15 MG/ML IJ SOLN
15.0000 mg | Freq: Three times a day (TID) | INTRAMUSCULAR | Status: DC | PRN
  Administered 2021-08-22 – 2021-08-24 (×3): 15 mg via INTRAVENOUS
  Filled 2021-08-22 (×3): qty 1

## 2021-08-22 MED ORDER — LORAZEPAM 2 MG/ML IJ SOLN
INTRAMUSCULAR | Status: AC
  Administered 2021-08-22: 0.5 mg via INTRAVENOUS
  Filled 2021-08-22: qty 1

## 2021-08-22 MED ORDER — LORAZEPAM 2 MG/ML IJ SOLN: 0.5000 mg | Freq: Once | INTRAMUSCULAR | Status: AC

## 2021-08-22 MED ORDER — LIDOCAINE HCL (PF) 1 % IJ SOLN
INTRAMUSCULAR | Status: AC
  Filled 2021-08-22: qty 5

## 2021-08-22 NOTE — Progress Notes (Signed)
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  Work-up done concerning for large right pleural effusion.  CT angiogram chest done negative for PE however showed a large right-sided pleural effusion with atelectasis of the right lung and mediastinal shift.  EDP performed ultrasound guided diagnostic and therapeutic thoracentesis with approximately 1 L of serosanguineous fluid removed.  Follow-up chest x-ray showed persistent large residual right pleural effusion without a pneumothorax.  Post right thoracentesis by IR 08/20/2021.  08/22/2021: Patient was seen and examined post right chest tube placement by PCCM.  She has some minor discomfort at the site of the sutures.  Admits to ongoing tobacco use half a pack a day, started smoking at the age of 54.  Has lost significant amount of weight 60 pounds within 6 months last year.  Assessment/Plan: Principal Problem:   Acute respiratory failure (HCC) Active Problems:   Idiopathic chronic pancreatitis ([place])   Essential hypertension   Uncontrolled diabetes mellitus secondary to pancreatic insufficiency (HCC)   Pleural effusion on right   Hypokalemia   Abnormal TSH  1. Acute respiratory failure secondary to large right transudative pleural effusion with mediastinal shift -Patient presented worsening shortness of breath.  Chest x-ray done consistent with a large right-sided pleural effusion.  CT angiogram chest done negative for PE however showed a large right-sided pleural effusion with atelectasis of the right lung and mediastinal shift to the left, narrowed appearance of right upper lower lobe bronchi, findings  consistent with chronic pancreatitis. -Body fluids with amylase level of 7141, glucose of 159, LDH 83, few mesothelial cells.  Pleural fluid cultures negative. -Etiology could be secondary to chronic pancreatitis versus GI source. -Abdominal ultrasound with no cholelithiasis identified, borderline enlargement of CBD with no obstructing lesion identified, echogenic lesion in the liver measuring up to 1.3 cm unchanged since 2017 likely benign lesion such as hemangioma, right pleural effusion. -Post thoracentesis on 08/20/2021 by IR chest x-ray with with large residual right pleural fluid, no pneumothorax. -Due to large residual right pleural fluid despite thoracentesis we will consult with PCCM for further evaluation and management. Right chest tube placed on 08/22/2021 with 1.4 L fluid out.  Fluid sent for analysis.  2.  Insulin-dependent diabetes mellitus -Hemoglobin A1c 5.1 -CBG 180 this morning. -Continues Semglee 10 units daily, SSI.  3.  Hypertension -Continue Avapro. -Start Norvasc 5 mg daily and uptitrate as needed for better blood pressure control.  4.  Tobacco abuse Tobacco use since the age of 15 Tobacco cessation counseled on at bedside. -Nicotine patch.  5.  Chronic pancreatitis with history of pseudocyst -Currently asymptomatic. -Seen by GI with plan for CT abdomen and pelvis with contrast  6.  Hypokalemia -K-Dur 20 mEq p.o. x1  7.  Abnormal TSH -TSH at 7.865. -Free T4 within normal limits at 1.11 -Will need repeat thyroid function studies done in the outpatient setting in 6 to 8 weeks.     DVT prophylaxis: scd Code Status: Full Family Communication: Updated patient.  No family at bedside Disposition:    Status is: Inpatient      Dispo: The patient is from: Home              Anticipated d/c is to: Home when PCCM  signed off.              Patient currently is not medically stable to d/c.              Difficult to place patient No           Consultants:   PCCM GI   Procedures:  Ultrasound-guided thoracentesis by EDP 08/20/2021 CT angiogram chest 08/20/2021 Chest x-ray 08/20/2021 Abdominal ultrasound 08/21/2021      Antimicrobials:  None       Objective: Vitals:   08/22/21 0930 08/22/21 0945 08/22/21 1000 08/22/21 1328  BP: (!) 150/102 (!) 149/97 (!) 144/97 (!) 134/96  Pulse: 70 71 70 79  Resp: '18 17 18 16  '$ Temp:    98.4 F (36.9 C)  TempSrc:    Oral  SpO2: 100% 100% 100% 98%  Weight:      Height:        Intake/Output Summary (Last 24 hours) at 08/22/2021 1507 Last data filed at 08/22/2021 1338 Gross per 24 hour  Intake 240 ml  Output 2000 ml  Net -1760 ml   Filed Weights   08/20/21 2012 08/21/21 1544  Weight: 57.2 kg 57.2 kg    Exam:  General: 40 y.o. year-old female well developed well nourished in no acute distress.  Alert and oriented x3. Cardiovascular: Regular rate and rhythm with no rubs or gallops.  No thyromegaly or JVD noted.   Respiratory: Clear to auscultation with no wheezes or rales. Good inspiratory effort. Abdomen: Soft nontender nondistended with normal bowel sounds x4 quadrants. Musculoskeletal: No lower extremity edema. 2/4 pulses in all 4 extremities. Skin: No ulcerative lesions noted or rashes, Psychiatry: Mood is appropriate for condition and setting   Data Reviewed: CBC: Recent Labs  Lab 08/20/21 1634  WBC 7.8  HGB 11.9*  HCT 33.0*  MCV 97.9  PLT [id number]   Basic Metabolic Panel: Recent Labs  Lab 08/20/21 1634 08/21/21 0500  NA 138 136  K 5.2* 3.4*  CL 110 109  CO2 23 22  GLUCOSE 216* 207*  BUN 13 10  CREATININE 1.05* 1.00  CALCIUM 9.0 8.9   GFR: Estimated Creatinine Clearance: 66.6 mL/min (by C-G formula based on SCr of 1 mg/dL). Liver Function Tests: Recent Labs  Lab 08/20/21 1634  AST 47*  ALT 37  ALKPHOS 80  BILITOT 1.0  PROT 5.7*  ALBUMIN 3.2*   Recent Labs  Lab 08/21/21 0500  AMYLASE 301*   No results for input(s): AMMONIA in the last 168  hours. Coagulation Profile: No results for input(s): INR, PROTIME in the last 168 hours. Cardiac Enzymes: No results for input(s): CKTOTAL, CKMB, CKMBINDEX, TROPONINI in the last 168 hours. BNP (last 3 results) No results for input(s): PROBNP in the last 8760 hours. HbA1C: Recent Labs    08/21/21 0500  HGBA1C 5.1   CBG: Recent Labs  Lab 08/21/21 0742 08/21/21 2138  GLUCAP 180* 392*   Lipid Profile: No results for input(s): CHOL, HDL, LDLCALC, TRIG, CHOLHDL, LDLDIRECT in the last 72 hours. Thyroid Function Tests: Recent Labs    08/21/21 0500  TSH 7.865*  FREET4 1.11   Anemia Panel: No results for input(s): VITAMINB12, FOLATE, FERRITIN, TIBC, IRON, RETICCTPCT in the last 72 hours. Urine analysis:    Component Value Date/Time   COLORURINE YELLOW 04/20/2016 1810   APPEARANCEUR CLOUDY (A) 04/20/2016 1810   LABSPEC 1.017 04/20/2016 1810   PHURINE 7.0 04/20/2016 1810   GLUCOSEU NEGATIVE 04/20/2016 1810   HGBUR NEGATIVE 04/20/2016 1810  BILIRUBINUR NEGATIVE 04/20/2016 1810   KETONESUR 40 (A) 04/20/2016 1810   PROTEINUR NEGATIVE 04/20/2016 1810   NITRITE NEGATIVE 04/20/2016 1810   LEUKOCYTESUR NEGATIVE 04/20/2016 1810   Sepsis Labs: '@LABRCNTIP'$ (procalcitonin:4,lacticidven:4)  ) Recent Results (from the past 240 hour(s))  Pleural fluid culture w Gram Stain     Status: None (Preliminary result)   Collection Time: 08/20/21  9:06 PM   Specimen: Pleural Fluid  Result Value Ref Range Status   Specimen Description PLEURAL  Final   Special Requests NONE  Final   Gram Stain   Final    RARE WBC PRESENT, PREDOMINANTLY MONONUCLEAR NO ORGANISMS SEEN    Culture   Final    NO GROWTH 2 DAYS Performed at [facility], [place] [address], [place], [place] [address]    Report Status PENDING  Incomplete  Resp Panel by RT-PCR (Flu A&B, Covid) Nasopharyngeal Swab     Status: None   Collection Time: 08/21/21 12:36 PM   Specimen: Nasopharyngeal Swab; Nasopharyngeal(NP) swabs in  vial transport medium  Result Value Ref Range Status   SARS Coronavirus 2 by RT PCR NEGATIVE NEGATIVE Final    Comment: (NOTE) SARS-CoV-2 target nucleic acids are NOT DETECTED.  The SARS-CoV-2 RNA is generally detectable in upper respiratory specimens during the acute phase of infection. The lowest concentration of SARS-CoV-2 viral copies this assay can detect is 138 copies/mL. A negative result does not preclude SARS-Cov-2 infection and should not be used as the sole basis for treatment or other patient management decisions. A negative result may occur with  improper specimen collection/handling, submission of specimen other than nasopharyngeal swab, presence of viral mutation(s) within the areas targeted by this assay, and inadequate number of viral copies(<138 copies/mL). A negative result must be combined with clinical observations, patient history, and epidemiological information. The expected result is Negative.  Fact Sheet for Patients:  EntrepreneurPulse.com.au  Fact Sheet for Healthcare Providers:  IncredibleEmployment.be  This test is no t yet approved or cleared by the [place] FDA and  has been authorized for detection and/or diagnosis of SARS-CoV-2 by FDA under an Emergency Use Authorization (EUA). This EUA will remain  in effect (meaning this test can be used) for the duration of the COVID-19 declaration under Section 564(b)(1) of the Act, 21 U.S.C.section 360bbb-3(b)(1), unless the authorization is terminated  or revoked sooner.       Influenza A by PCR NEGATIVE NEGATIVE Final   Influenza B by PCR NEGATIVE NEGATIVE Final    Comment: (NOTE) The Xpert Xpress SARS-CoV-2/FLU/RSV plus assay is intended as an aid in the diagnosis of influenza from Nasopharyngeal swab specimens and should not be used as a sole basis for treatment. Nasal washings and aspirates are unacceptable for Xpert Xpress SARS-CoV-2/FLU/RSV testing.  Fact  Sheet for Patients: EntrepreneurPulse.com.au  Fact Sheet for Healthcare Providers: IncredibleEmployment.be  This test is not yet approved or cleared by the [place] FDA and has been authorized for detection and/or diagnosis of SARS-CoV-2 by FDA under an Emergency Use Authorization (EUA). This EUA will remain in effect (meaning this test can be used) for the duration of the COVID-19 declaration under Section 564(b)(1) of the Act, 21 U.S.C. section 360bbb-3(b)(1), unless the authorization is terminated or revoked.  Performed at [facility], [place] [address], [place], [place] [address]       Studies: CT ABDOMEN PELVIS W CONTRAST  Result Date: 08/22/2021 CLINICAL DATA:  Persistent pancreatitis EXAM: CT ABDOMEN AND PELVIS WITH CONTRAST TECHNIQUE: Multidetector CT  imaging of the abdomen and pelvis was performed using the standard protocol following bolus administration of intravenous contrast. CONTRAST:  27m OMNIPAQUE IOHEXOL 350 MG/ML SOLN, additional oral enteric contrast COMPARISON:  04/24/2020 FINDINGS: Lower chest: Small right pleural effusion and heterogeneous airspace opacity of the right lung base. Hepatobiliary: No solid liver abnormality is seen. No gallstones, gallbladder wall thickening, or biliary dilatation. Pancreas: Severe, extensive calcification of the pancreatic parenchyma and diffuse prominence of the pancreatic duct. Superior to the pancreatic neck and body, and underlying the left hemidiaphragm, there is an elongated fluid collection interposed between the left adrenal gland and the lesser curvature of the stomach, measuring approximately 5.3 x 1.0 x 0.8 cm (series 3, image 15, series 6, image 43). This is in the vicinity of previously identified pseudocyst or acute pancreatic fluid collection on examination dated 04/24/2020. Spleen: Normal in size without significant abnormality. Adrenals/Urinary Tract: Adrenal glands are  unremarkable. Kidneys are normal, without renal calculi, solid lesion, or hydronephrosis. Bladder is unremarkable. Stomach/Bowel: Stomach is within normal limits. Appendix appears normal. No evidence of bowel wall thickening, distention, or inflammatory changes. Vascular/Lymphatic: No significant vascular findings are present. No enlarged abdominal or pelvic lymph nodes. Reproductive: Status post hysterectomy. Other: No abdominal wall hernia or abnormality. Small volume free fluid in the low pelvis (series 3, image 69). Musculoskeletal: No acute or significant osseous findings. IMPRESSION: 1. Severe, extensive calcification of the pancreatic parenchyma and diffuse prominence of the pancreatic duct, consistent with chronic stigmata of pancreatitis. There are no overt inflammatory findings of the pancreatic parenchyma at this time. 2. Superior to the pancreatic neck and body, and underlying the left hemidiaphragm, there is an elongated fluid collection interposed between the left adrenal gland and the lesser curvature of the stomach, measuring approximately 5.3 x 1.0 x 0.8 cm. This is in the vicinity of previously identified pseudocyst or acute pancreatic fluid collection on examination dated 04/24/2020. Findings are again consistent with pseudocyst or acute pancreatic fluid collection. 3. Small right pleural effusion and heterogeneous airspace opacity of the right lung base, consistent with infection or aspiration. 4. Small volume free fluid in the low pelvis, likely reactive. 5. Status post hysterectomy. Electronically Signed   By: AEddie CandleM.D.   On: 08/22/2021 13:15   DG CHEST PORT 1 VIEW  Result Date: 08/22/2021 CLINICAL DATA:  Right pleural effusion. EXAM: PORTABLE CHEST 1 VIEW COMPARISON:  August 20, 2021. FINDINGS: The heart size and mediastinal contours are within normal limits. Left lung is clear. Moderate-sized right pleural effusion is noted which is slightly decreased compared to prior exam.  Interval placement of right-sided pigtail pleural drainage catheter. No pneumothorax is noted. The visualized skeletal structures are unremarkable. IMPRESSION: Moderate-size right pleural effusion is noted which is slightly decreased since pigtail pleural drainage catheter placement. Electronically Signed   By: JMarijo ConceptionM.D.   On: 08/22/2021 10:30    Scheduled Meds:  amLODipine  5 mg Oral Daily   insulin aspart  0-5 Units Subcutaneous QHS   insulin aspart  0-9 Units Subcutaneous TID WC   insulin degludec  12 Units Subcutaneous QAC breakfast   iohexol       irbesartan  150 mg Oral Daily   lidocaine (PF)       nicotine  21 mg Transdermal Daily    Continuous Infusions:   LOS: 0 days     CKayleen Memos MD [facility] Pager [phone] If 7PM-7AM, please contact night-coverage [email or web address] Password [email or web address] 08/22/2021, 3:07 PM

## 2021-08-22 NOTE — Progress Notes (Signed)
Assisted Suzanne Weeks with Chest Tube placement.  Patient tolerated well.  1450 cc pleural fluid out.  Pleural fluid sent to lab.

## 2021-08-22 NOTE — Progress Notes (Signed)
Unclamped chest tube to allow to drain.  Prior to unclamping, Carney had 1.4L fluid drained.  Chest tube on water seal. Pt reports mild discomfort at chest tube site.  [place] will need to be changed this afternoon.       Noe Gens, MSN, APRN, NP-C, AGACNP-BC [facility] 08/22/2021, 11:47 AM   Please see [email or web address] for pager details.   From 7A-7P if no response, please call [phone] After hours, please call ELink [phone]

## 2021-08-22 NOTE — Progress Notes (Signed)
Patient's chest tube drainage was 550 cc. It was 1450 at 1000 am. Total amount was 2000 cc. Change another drainage set to continue drainage.

## 2021-08-22 NOTE — Progress Notes (Signed)
Inpatient Diabetes Program Recommendations  AACE/ADA: New Consensus Statement on Inpatient Glycemic Control (2015)  Target Ranges:  Prepandial:   less than 140 mg/dL      Peak postprandial:   less than 180 mg/dL (1-2 hours)      Critically ill patients:  140 - 180 mg/dL   Lab Results  Component Value Date   GLUCAP 392 (H) 08/21/2021   HGBA1C 5.1 08/21/2021    Review of Glycemic Control Results for QUINNLYN, PACIONE (MRN [id number]) as of 08/22/2021 10:30  Ref. Range 08/21/2021 07:42 08/21/2021 21:38  Glucose-Capillary Latest Ref Range: 70 - 99 mg/dL 180 (H) 392 (H)   Diabetes history: DM 1 Outpatient Diabetes medications:  Novolog 1-4 units tid with meals Tresiba 12 units daily Current orders for Inpatient glycemic control:  Novolog sensitive tid with meals and HS Tresiba 12 units daily  Inpatient Diabetes Program Recommendations:    Note patient admitted with SOB.  Agree with current insulin orders.  Patient has history of low blood sugars in the past and wears a Dexcom sensor.  Will follow.   Thanks,  Adah Perl, RN, BC-ADM Inpatient Diabetes Coordinator Pager [phone]  (8a-5p)

## 2021-08-22 NOTE — Progress Notes (Signed)
Assessed patient to change [place] drainage system.  Bedside RN had already exchanged.  Patient reports ongoing discomfort.    -PRN percocet ordered in addition to toradol, can be used in between toradol dosing for pain  -follow up CXR in am    Noe Gens, MSN, APRN, NP-C, AGACNP-BC [facility] 08/22/2021, 5:12 PM   Please see [email or web address] for pager details.   From 7A-7P if no response, please call [phone] After hours, please call ELink [phone]

## 2021-08-22 NOTE — Plan of Care (Signed)
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
## 2021-08-22 NOTE — Consult Note (Signed)
Referring Provider: Noe Gens NP Primary Care Physician:  Curt Jews, PA-C Primary Gastroenterologist:  Dr. Therisa Doyne  Reason for Consultation:  Amylase in pleural fluid, hx of chronic pancreatitis  HPI: Suzanne Weeks is a 40 y.o. 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  Patient presented to ED with progressive dyspnea. She was found to have a large right pleural effusion. Cytology of pleural fluid showed amylase present and GI was consulted.  Patient denies nausea, vomiting, abdominal pain, changes in stool, melena, hematochezia. Denies unintentional weight loss currently, however, had approximately 60lb weight loss in April 2021 that was attributed to type II DM.  Uses Aleve on occasion, otherwise no ASA, NSAID, or blood thinner use. Typically has 2-3 glasses of wine on the weekends. No family history of colon cancer or other GI malignancy.  Last colonoscopy 08/2020. Two 3-18m sessile serrated adenomas in transverse colon and hepatic flexure. One 666mhyperplastic polyp in descending colon. One 77m75myperplastic polyp in rectum.  Past Medical History:  Diagnosis Date   Essential hypertension 07/01/2020   GERD without esophagitis 07/01/2020   Hypertension    Idiopathic chronic pancreatitis (HCC)    Nicotine dependence, cigarettes, uncomplicated [id number] of pancreas    Uncontrolled diabetes mellitus secondary to pancreatic insufficiency [place]   chronic pancreatitis with pseudocyst formation   Uterine fibroid     Past Surgical History:  Procedure Laterality Date   ABDOMINAL HYSTERECTOMY      Prior to Admission medications   Medication Sig Start Date End Date Taking? Authorizing Provider  ascorbic acid (VITAMIN C) 500 MG tablet Take 1,000 mg by  mouth daily.   Yes [provider]  insulin aspart (NOVOLOG FLEXPEN) 100 UNIT/ML FlexPen Inject 1-4 Units into the skin 3 (three) times daily before meals. Sliding scale   Yes [provider]  insulin degludec (TRESIBA) 100 UNIT/ML FlexTouch Pen Inject 0.1 mLs (10 Units total) into the skin daily. Patient taking differently: Inject 12 Units into the skin daily. 07/03/20  Yes FelCharlynne CousinsD  irbesartan (AVAPRO) 150 MG tablet Take 150 mg by mouth daily. 07/20/21  Yes [provider]  Multiple Vitamin (MULTIVITAMIN WITH MINERALS) TABS tablet Take 1 tablet by mouth daily.   Yes [provider]  naproxen sodium (ALEVE) 220 MG tablet Take 220 mg by mouth daily as needed (pain).   Yes [provider]  vitamin B-12 (CYANOCOBALAMIN) 500 MCG tablet Take 1 tablet (500 mcg total) by mouth daily. 07/03/20  Yes FelCharlynne CousinsD  Vitamin D, Ergocalciferol, (DRISDOL) 1.25 MG (50000 UNIT) CAPS capsule Take 50,000 Units by mouth once a week. 08/13/21  Yes [provider]    Scheduled Meds:  amLODipine  5 mg Oral Daily   insulin aspart  0-5 Units Subcutaneous QHS   insulin aspart  0-9 Units Subcutaneous TID WC   insulin degludec  12 Units Subcutaneous QAC breakfast   irbesartan  150 mg Oral Daily   nicotine  21 mg Transdermal Daily   Continuous Infusions: PRN Meds:.acetaminophen **OR** acetaminophen, ondansetron **OR** ondansetron (ZOFRAN) IV, senna-docusate  Allergies as of 08/20/2021   (No Known Allergies)    Family History  Problem Relation Age of Onset   Diabetes Father     Social History   Socioeconomic History   Marital status: Single    Spouse name: Not  on file   Number of children: Not on file   Years of education: Not on file   Highest education level: Not on file  Occupational History   Not on file  Tobacco Use   Smoking status: Every Day    Packs/day: 0.50    Types: Cigarettes   Smokeless tobacco: Never   Substance and Sexual Activity   Alcohol use: Not Currently    Comment: quit drinking 2014   Drug use: Not on file   Sexual activity: Not on file  Other Topics Concern   Not on file  Social History Narrative   Not on file   Social Determinants of Health   Financial Resource Strain: Not on file  Food Insecurity: Not on file  Transportation Needs: Not on file  Physical Activity: Not on file  Stress: Not on file  Social Connections: Not on file  Intimate Partner Violence: Not on file    Review of Systems: Review of Systems  Constitutional:  Negative for chills and fever.  HENT:  Negative for congestion and sore throat.   Eyes:  Negative for pain and redness.  Respiratory:  Positive for shortness of breath. Negative for cough.   Cardiovascular:  Negative for chest pain and palpitations.  Gastrointestinal:  Negative for abdominal pain, blood in stool, constipation, diarrhea, heartburn, melena, nausea and vomiting.  Genitourinary:  Negative for flank pain and hematuria.  Musculoskeletal:  Negative for falls and joint pain.  Skin:  Negative for itching and rash.  Neurological:  Negative for seizures and loss of consciousness.  Psychiatric/Behavioral:  Positive for substance abuse (tobacco). The patient is not nervous/anxious.     Physical Exam:Physical Exam Constitutional:      General: She is not in acute distress.    Appearance: She is normal weight. She is not ill-appearing.  HENT:     Head: Normocephalic and atraumatic.     Nose: Nose normal. No congestion.     Mouth/Throat:     Mouth: Mucous membranes are moist.     Pharynx: Oropharynx is clear.  Eyes:     General: No scleral icterus.    Extraocular Movements: Extraocular movements intact.  Cardiovascular:     Rate and Rhythm: Normal rate and regular rhythm.  Pulmonary:     Effort: Pulmonary effort is normal. No tachypnea or respiratory distress.     Breath sounds: Examination of the right-upper field reveals  decreased breath sounds. Examination of the right-middle field reveals decreased breath sounds. Examination of the right-lower field reveals decreased breath sounds. Decreased breath sounds present.  Abdominal:     General: Abdomen is flat. Bowel sounds are normal. There is no distension.     Palpations: Abdomen is soft. There is no mass.     Tenderness: There is no abdominal tenderness. There is no guarding or rebound.  Musculoskeletal:        General: No swelling. Normal range of motion.     Cervical back: Normal range of motion and neck supple.  Skin:    General: Skin is warm and dry.     Coloration: Skin is not jaundiced or pale.  Neurological:     General: No focal deficit present.     Mental Status: She is alert and oriented to person, place, and time.  Psychiatric:        Mood and Affect: Mood normal.        Behavior: Behavior normal.    Vital signs: Vitals:   08/22/21 0504 08/22/21 [id number]  BP: 140/90 (!) 152/94  Pulse:  71  Resp:  17  Temp:  98 F (36.7 C)  SpO2:  100%   Last BM Date: 08/22/21    GI:  Lab Results: Recent Labs    08/20/21 1634  WBC 7.8  HGB 11.9*  HCT 33.0*  PLT 355   BMET Recent Labs    08/20/21 1634 08/21/21 0500  NA 138 136  K 5.2* 3.4*  CL 110 109  CO2 23 22  GLUCOSE 216* 207*  BUN 13 10  CREATININE 1.05* 1.00  CALCIUM 9.0 8.9   LFT Recent Labs    08/20/21 1634  PROT 5.7*  ALBUMIN 3.2*  AST 47*  ALT 37  ALKPHOS 80  BILITOT 1.0   PT/INR No results for input(s): LABPROT, INR in the last 72 hours.   Studies/Results: CT Angio Chest PE W and/or Wo Contrast  Result Date: 08/20/2021 CLINICAL DATA:  Shortness of breath EXAM: CT ANGIOGRAPHY CHEST WITH CONTRAST TECHNIQUE: Multidetector CT imaging of the chest was performed using the standard protocol during bolus administration of intravenous contrast. Multiplanar CT image reconstructions and MIPs were obtained to evaluate the vascular anatomy. CONTRAST:  42m OMNIPAQUE IOHEXOL  350 MG/ML SOLN COMPARISON:  Chest x-ray 08/20/2021 FINDINGS: Cardiovascular: Satisfactory opacification of the pulmonary arteries to the segmental level. No evidence of pulmonary embolism. Normal cardiac size. Trace pericardial effusion. Mediastinum/Nodes: Shift of mediastinal contents to the left. Leftward deviation of trachea and esophagus. No grossly enlarged mediastinal lymph nodes. No thyroid mass Lungs/Pleura: Large left-sided right pleural effusion with atelectasis of the right lung. Small amount of aerated right upper lobe. Narrowed appearing right upper and lower lobe bronchi. Upper Abdomen: Numerous pancreatic calcifications consistent with chronic pancreatitis. No acute abnormality Musculoskeletal: No chest wall abnormality. No acute or significant osseous findings. Review of the MIP images confirms the above findings. IMPRESSION: 1. Negative for acute pulmonary embolus. 2. Large right-sided pleural effusion with atelectasis of right lung and shift of mediastinal contents to the left. Narrowed appearance of right upper and lower lobe bronchi. 3. Findings consistent with chronic pancreatitis Electronically Signed   By: KDonavan FoilM.D.   On: 08/20/2021 19:23   DG Chest Portable 1 View  Result Date: 08/20/2021 CLINICAL DATA:  Status post right thoracentesis EXAM: PORTABLE CHEST 1 VIEW COMPARISON:  4:55 p.m. FINDINGS: Large right pleural effusion persists with collapse of the right middle and lower lobes and compressive atelectasis of the right upper lobe, slightly improved since prior examination. Left lung is clear. No pneumothorax. No pleural effusion on the left. Cardiac size within normal limits. IMPRESSION: Status post right thoracentesis with large residual right pleural fluid. No pneumothorax. Electronically Signed   By: AFidela SalisburyM.D.   On: 08/20/2021 21:58   DG Chest Portable 1 View  Result Date: 08/20/2021 CLINICAL DATA:  Shortness of breath.  Pleural effusion. EXAM: PORTABLE CHEST 1  VIEW COMPARISON:  Chest radiographs 07/01/2020. No recent prior studies available. FINDINGS: 1655 hours. There is a very large right pleural effusion with near complete opacification of the right hemithorax and mild mediastinal shift to the left. The heart size appears stable. The left lung is clear. There is no pneumothorax. The bones appear unchanged. Telemetry leads overlie the chest. There are calcifications in the left upper quadrant of the abdomen suggesting chronic calcific pancreatitis. This was seen on prior abdominal CT 04/24/2020. IMPRESSION: Very large right pleural effusion of with resulting subtotal right lung collapse and mediastinal shift to the left. Electronically  Signed   By: Richardean Sale M.D.   On: 08/20/2021 17:16   US Abdomen Limited RUQ (LIVER/GB)  Result Date: 08/21/2021 CLINICAL DATA:  Pancreatitis EXAM: ULTRASOUND ABDOMEN LIMITED RIGHT UPPER QUADRANT COMPARISON:  CTA chest 08/20/2021, CT abdomen/pelvis 04/29/2013 abdominal ultrasound 04/20/2016 FINDINGS: Gallbladder: No gallstones or wall thickening visualized. No sonographic Murphy sign noted by sonographer. Common bile duct: Diameter: 6 mm Liver: There is an echogenic area in the right hepatic lobe measuring 0.7 cm x 1.4 cm x 0.7 cm. Parenchymal echogenicity is otherwise normal. Portal vein is patent on color Doppler imaging with normal direction of blood flow towards the liver. Other: There is a right pleural effusion. IMPRESSION: 1. No cholelithiasis identified. Borderline enlargement of the common bile duct with no obstructing lesion identified. 2. Echogenic lesion in the liver measuring up to 1.3 cm is unchanged since 2017. Given stability, this likely reflects a benign lesion such as a hemangioma. 3. Right pleural effusion. Electronically Signed   By: Valetta Mole M.D.   On: 08/21/2021 10:22    Impression: Amylase in pleural fluid, hx of chronic pancreatitis - Amylase 7,141 in pleural fluid cytology - HGB 11.9 as of 9/5 -  Mildly elevated AST (47) otherwise normal LFTs as of 9/5 - CTA Chest: Numerous pancreatic calcifications consistent with chronic pancreatitis. No acute abnormality   DM Type II  Large Right Pleural Effusion with mediastinal shift   Plan:  Will obtain CT abdomen pelvis with contrast to r/o underlying malignancy  Will also order CA 19-9 and CEA  Pending CT results, consider MRI of abdomen  [facility] will follow    LOS: 0 days   Dessa Ledee Radford Pax  PA-C 08/22/2021, 8:59 AM  Contact #  [phone]

## 2021-08-22 NOTE — Procedures (Signed)
Insertion of Chest Tube Procedure Note  Suzanne Weeks  [id number]  Jun 18, 1981  Date:08/22/21  Time:9:57 AM    Provider Performing: Noe Gens, NP-C, AGACNP-BC  Procedure: Chest Tube Insertion [id number])  Indication(s) Effusion  Consent Risks of the procedure as well as the alternatives and risks of each were explained to the patient and/or caregiver.  Consent for the procedure was obtained and is signed in the bedside chart  Anesthesia Topical only with 1% lidocaine    Time Out Verified patient identification, verified procedure, site/side was marked, verified correct patient position, special equipment/implants available, medications/allergies/relevant history reviewed, required imaging and test results available.   Sterile Technique Maximal sterile technique including full sterile barrier drape, hand hygiene, sterile gown, sterile gloves, mask, hair covering, sterile ultrasound probe cover (if used).   Procedure Description Ultrasound used to identify appropriate pleural anatomy for placement and overlying skin marked. Area of placement cleaned and draped in sterile fashion.  A 14 French pigtail pleural catheter was placed into the right pleural space using Seldinger technique. Appropriate return of fluid was obtained.  The tube was connected to atrium and placed on -20 cm H2O wall suction.   Complications/Tolerance None; patient tolerated the procedure well. Chest X-ray is ordered to verify placement.   EBL Minimal  Specimen(s) Sample sent to cytology for repeat analysis.     Noe Gens, MSN, APRN, NP-C, AGACNP-BC [facility] 08/22/2021, 9:59 AM   Please see [email or web address] for pager details.   From 7A-7P if no response, please call [phone] After hours, please call ELink [phone]

## 2021-08-23 ENCOUNTER — Inpatient Hospital Stay (HOSPITAL_COMMUNITY): Payer: BC Managed Care – PPO

## 2021-08-23 DIAGNOSIS — J90 Pleural effusion, not elsewhere classified: Secondary | ICD-10-CM | POA: Diagnosis not present

## 2021-08-23 LAB — CBC
HCT: 31.6 % — ABNORMAL LOW (ref 36.0–46.0)
Hemoglobin: 11.5 g/dL — ABNORMAL LOW (ref 12.0–15.0)
MCH: 35.6 pg — ABNORMAL HIGH (ref 26.0–34.0)
MCHC: 36.4 g/dL — ABNORMAL HIGH (ref 30.0–36.0)
MCV: 97.8 fL (ref 80.0–100.0)
Platelets: 292 10*3/uL (ref 150–400)
RBC: 3.23 MIL/uL — ABNORMAL LOW (ref 3.87–5.11)
RDW: 13.2 % (ref 11.5–15.5)
WBC: 13.3 10*3/uL — ABNORMAL HIGH (ref 4.0–10.5)
nRBC: 0 % (ref 0.0–0.2)

## 2021-08-23 LAB — COMPREHENSIVE METABOLIC PANEL
ALT: 17 U/L (ref 0–44)
AST: 23 U/L (ref 15–41)
Albumin: 2.6 g/dL — ABNORMAL LOW (ref 3.5–5.0)
Alkaline Phosphatase: 58 U/L (ref 38–126)
Anion gap: 9 (ref 5–15)
BUN: 12 mg/dL (ref 6–20)
CO2: 18 mmol/L — ABNORMAL LOW (ref 22–32)
Calcium: 8.6 mg/dL — ABNORMAL LOW (ref 8.9–10.3)
Chloride: 105 mmol/L (ref 98–111)
Creatinine, Ser: 1.07 mg/dL — ABNORMAL HIGH (ref 0.44–1.00)
GFR, Estimated: >60 mL/min (ref >60)
Glucose, Bld: 219 mg/dL — ABNORMAL HIGH (ref 70–99)
Potassium: 4.5 mmol/L (ref 3.5–5.1)
Sodium: 132 mmol/L — ABNORMAL LOW (ref 135–145)
Total Bilirubin: 1 mg/dL (ref 0.3–1.2)
Total Protein: 5.1 g/dL — ABNORMAL LOW (ref 6.5–8.1)

## 2021-08-23 LAB — CEA: CEA: 2.6 ng/mL (ref 0.0–4.7)

## 2021-08-23 LAB — GLUCOSE, CAPILLARY: Glucose-Capillary: 112 mg/dL — ABNORMAL HIGH (ref 70–99)

## 2021-08-23 LAB — BRAIN NATRIURETIC PEPTIDE: B Natriuretic Peptide: 256.9 pg/mL — ABNORMAL HIGH (ref 0.0–100.0)

## 2021-08-23 LAB — CYTOLOGY - NON PAP

## 2021-08-23 LAB — CANCER ANTIGEN 19-9: CA 19-9: 19 U/mL (ref 0–35)

## 2021-08-23 LAB — PROCALCITONIN: Procalcitonin: 0.17 ng/mL

## 2021-08-23 MED ORDER — GADOBUTROL 1 MMOL/ML IV SOLN
5.5000 mL | Freq: Once | INTRAVENOUS | Status: AC | PRN
  Administered 2021-08-23: 5.5 mL via INTRAVENOUS

## 2021-08-23 MED ORDER — GADOBUTROL 1 MMOL/ML IV SOLN: 5.7000 mL | Freq: Once | INTRAVENOUS | Status: DC | PRN
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
## 2021-08-23 NOTE — Progress Notes (Signed)
[facility] Progress Note  Suzanne Weeks 40 y.o. [birth year]  CC:  Amylase in pleural fluid, hx of chronic pancreatitis   Subjective: Patient denies nausea, vomiting, abdominal pain, changes in stool.  ROS : Review of Systems  Cardiovascular:  Negative for palpitations and leg swelling.  Gastrointestinal:  Negative for abdominal pain, blood in stool, constipation, diarrhea, heartburn, melena, nausea and vomiting.     Objective: Vital signs in last 24 hours: Vitals:   08/22/21 2321 08/23/21 0510  BP: (!) 143/88 133/90  Pulse: 63 60  Resp: 17 18  Temp: 98.9 F (37.2 C) 98.6 F (37 C)  SpO2: 100% 100%    Physical Exam:  General:  Alert, cooperative, no distress, appears stated age  Head:  Normocephalic, without obvious abnormality, atraumatic  Eyes:  Anicteric sclera, EOM's intact  Lungs:   Clear to auscultation bilaterally, respirations unlabored  Heart:  Regular rate and rhythm, S1, S2 normal  Abdomen:   Soft, non-tender, bowel sounds active all four quadrants     Lab Results: Recent Labs    08/21/21 0500 08/23/21 0059  NA 136 132*  K 3.4* 4.5  CL 109 105  CO2 22 18*  GLUCOSE 207* 219*  BUN 10 12  CREATININE 1.00 1.07*  CALCIUM 8.9 8.6*   Recent Labs    08/20/21 1634 08/23/21 0059  AST 47* 23  ALT 37 17  ALKPHOS 80 58  BILITOT 1.0 1.0  PROT 5.7* 5.1*  ALBUMIN 3.2* 2.6*   Recent Labs    08/20/21 1634 08/23/21 0059  WBC 7.8 13.3*  HGB 11.9* 11.5*  HCT 33.0* 31.6*  MCV 97.9 97.8  PLT 355 292   No results for input(s): LABPROT, INR in the last 72 hours.    Assessment Amylase in pleural fluid, hx of chronic pancreatitis -CT Abdomen pelvis with contrast: Severe, extensive calcification of the pancreatic parenchyma and diffuse prominence of the pancreatic duct, consistent with chronic stigmata of pancreatitis. Elongated fluid collection superior to pancreas measuring 5.3 x 1.0 x 0.8 cm, which is consistent with pseudocyst or acute  pancreatic fluid collection which was previously drained 04/24/2020 -CEA and CA 19-9 normal -Amylase 7,141 in pleural fluid cytology -HGB 11.5 -WBC 13.3, increased from 7.8 9/5. Suspect due to pancreatitis vs pleural effusion -Renal function stable BUN 12, Cr 1.07  Large right pleural effusion with mediastinal shift  Insulin dependent DM Type II  Plan: Pancreatic pseudocyst increased in size from previous EUS 04/2020. Recommend outpatient EUS for drainage of cyst. Malignancy less likely with unremarkable CEA and CA 19-9.  Will order MRI/MRCP for further evaluation of pancreatitis and pseudocyst.  IgG 4 ordered to rule out autoimmune pancreatitis.  Recommend outpatient GI follow up in 4-6 weeks.  [facility] will sign off. Please contact us if we can be of any further assistance during this hospital stay.   Garnette Scheuermann PA-C 08/23/2021, 9:34 AM  Contact #  [phone]

## 2021-08-23 NOTE — Progress Notes (Signed)
PROGRESS NOTE  Suzanne Weeks [id number] DOB: 04-Oct-1981 DOA: 08/20/2021 PCP: Curt Jews, PA-C  HPI/Recap of past 24 hours:  Patient 40 year old female history of idiopathic chronic pancreatitis with history of cirrhosis, insulin-dependent diabetes felt secondary to chronic pancreatic insufficiency, hypertension, GERD, tobacco use presented to the ED with shortness of breath.  Work-up done concerning for large right pleural effusion.  CT angiogram chest done negative for PE however showed a large right-sided pleural effusion with atelectasis of the right lung and mediastinal shift.  EDP performed ultrasound guided diagnostic and therapeutic thoracentesis with approximately 1 L of serosanguineous fluid removed.  Follow-up chest x-ray showed persistent large residual right pleural effusion without a pneumothorax.  Post right thoracentesis by IR 08/20/2021.  08/22/2021: Patient was seen and examined post right chest tube placement by PCCM.  She has some minor discomfort at the site of the sutures.  Admits to ongoing tobacco use half a pack a day, started smoking at the age of 77.  Has lost significant amount of weight 60 pounds within 6 months last year.  08/23/2021: She feels better and her breathing is improved this morning.  Right chest tube in place.  Chest x-ray shows improvement of right pleural effusion.  Assessment/Plan: Principal Problem:   Acute respiratory failure (HCC) Active Problems:   Idiopathic chronic pancreatitis ([place])   Essential hypertension   Uncontrolled diabetes mellitus secondary to pancreatic insufficiency (HCC)   Pleural effusion on right   Hypokalemia   Abnormal TSH  Improving, acute respiratory failure secondary to large right transudative pleural effusion with mediastinal shift -Patient presented worsening shortness of breath.  Chest x-ray done consistent with a large right-sided pleural effusion.  CT angiogram chest done negative for PE however showed a large  right-sided pleural effusion with atelectasis of the right lung and mediastinal shift to the left, narrowed appearance of right upper lower lobe bronchi, findings consistent with chronic pancreatitis. -Body fluids with amylase level of 7141, glucose of 159, LDH 83, few mesothelial cells.  Pleural fluid cultures negative. -Etiology could be secondary to chronic pancreatitis versus GI source. -Abdominal ultrasound with no cholelithiasis identified, borderline enlargement of CBD with no obstructing lesion identified, echogenic lesion in the liver measuring up to 1.3 cm unchanged since 2017 likely benign lesion such as hemangioma, right pleural effusion. -Post thoracentesis on 08/20/2021 by IR chest x-ray with with large residual right pleural fluid, no pneumothorax. -Due to large residual right pleural fluid despite thoracentesis we will consult with PCCM for further evaluation and management. Right chest tube placed on 08/22/2021 with 1.4 L fluid out.  Fluid sent for analysis. Chest x-ray personally reviewed showed improvement of right pleural effusion. Oxygen saturation much improved 98% on room air.  Leukocytosis, likely reactive in the setting of chest tube placement Procalcitonin negative Afebrile Continue to monitor and repeat CBC in the morning.  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  Hypertension BP is at goal. -Continue Avapro. -Start Norvasc 5 mg daily and uptitrate as needed for better blood pressure control.  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 -Nicotine patch.  Chronic pancreatitis with history of pseudocyst -Currently asymptomatic. -Seen by GI with plan for CT abdomen and pelvis with contrast  Resolved post repletion: Hypokalemia Potassium 4.5.  Mild non-anion gap metabolic acidosis Serum bicarb 18, anion gap 9 Monitor for now  Abnormal  TSH -TSH at 7.865. -Free T4 within normal  limits at 1.11 -Will need repeat thyroid function studies done in the outpatient setting in 6 to 8 weeks.     DVT prophylaxis: scd Code Status: Full Family Communication: Updated patient.  No family at bedside Disposition:    Status is: Inpatient      Dispo: The patient is from: Home              Anticipated d/c is to: Home when PCCM signed off.              Patient currently is not medically stable to d/c.              Difficult to place patient No           Consultants:  PCCM GI   Procedures:  Ultrasound-guided thoracentesis by EDP 08/20/2021 CT angiogram chest 08/20/2021 Chest x-ray 08/20/2021 Abdominal ultrasound 08/21/2021      Antimicrobials:  None       Objective: Vitals:   08/22/21 2321 08/23/21 0510 08/23/21 1208 08/23/21 1650  BP: (!) 143/88 133/90 120/85 114/85  Pulse: 63 60 80 77  Resp: '17 18 19 18  '$ Temp: 98.9 F (37.2 C) 98.6 F (37 C) 98.4 F (36.9 C) 98.3 F (36.8 C)  TempSrc: Oral Oral Oral Oral  SpO2: 100% 100% 98% 98%  Weight:      Height:        Intake/Output Summary (Last 24 hours) at 08/23/2021 1921 Last data filed at 08/23/2021 1000 Gross per 24 hour  Intake 600 ml  Output --  Net 600 ml   Filed Weights   08/20/21 2012 08/21/21 1544  Weight: 57.2 kg 57.2 kg    Exam:  General: 40 y.o. year-old female well-developed well-nourished in no acute distress patient is alert oriented x3.   Cardiovascular: Regular rate and rhythm no rubs or gallops.  No JVD or thyromegaly noted.   Respiratory: Clear to auscultation no wheezes or rales.  Poor inspiratory effort.  Abdomen: Soft nontender normal bowel sounds present.   Musculoskeletal: No lower extremity edema bilaterally.   Skin: No ulcerative lesions noted. Psychiatry: Mood is appropriate for condition and setting.   Data Reviewed: CBC: Recent Labs  Lab 08/20/21 1634 08/23/21 0059  WBC 7.8 13.3*  HGB 11.9* 11.5*  HCT 33.0* 31.6*  MCV  97.9 97.8  PLT 355 [id number]   Basic Metabolic Panel: Recent Labs  Lab 08/20/21 1634 08/21/21 0500 08/23/21 0059  NA 138 136 132*  K 5.2* 3.4* 4.5  CL 110 109 105  CO2 23 22 18*  GLUCOSE 216* 207* 219*  BUN '13 10 12  '$ CREATININE 1.05* 1.00 1.07*  CALCIUM 9.0 8.9 8.6*   GFR: Estimated Creatinine Clearance: 62.3 mL/min (A) (by C-G formula based on SCr of 1.07 mg/dL (H)). Liver Function Tests: Recent Labs  Lab 08/20/21 1634 08/23/21 0059  AST 47* 23  ALT 37 17  ALKPHOS 80 58  BILITOT 1.0 1.0  PROT 5.7* 5.1*  ALBUMIN 3.2* 2.6*   Recent Labs  Lab 08/21/21 0500  AMYLASE 301*   No results for input(s): AMMONIA in the last 168 hours. Coagulation Profile: No results for input(s): INR, PROTIME in the last 168 hours. Cardiac Enzymes: No results for input(s): CKTOTAL, CKMB, CKMBINDEX, TROPONINI in the last 168 hours. BNP (last 3 results) No results for input(s): PROBNP in the last 8760 hours. HbA1C: Recent Labs    08/21/21 0500  HGBA1C 5.1   CBG: Recent Labs  Lab 08/21/21 0742 08/21/21 2138  GLUCAP  180* 392*   Lipid Profile: No results for input(s): CHOL, HDL, LDLCALC, TRIG, CHOLHDL, LDLDIRECT in the last 72 hours. Thyroid Function Tests: Recent Labs    08/21/21 0500  TSH 7.865*  FREET4 1.11   Anemia Panel: No results for input(s): VITAMINB12, FOLATE, FERRITIN, TIBC, IRON, RETICCTPCT in the last 72 hours. Urine analysis:    Component Value Date/Time   COLORURINE YELLOW 04/20/2016 1810   APPEARANCEUR CLOUDY (A) 04/20/2016 1810   LABSPEC 1.017 04/20/2016 1810   PHURINE 7.0 04/20/2016 1810   GLUCOSEU NEGATIVE 04/20/2016 1810   HGBUR NEGATIVE 04/20/2016 1810   BILIRUBINUR NEGATIVE 04/20/2016 1810   KETONESUR 40 (A) 04/20/2016 1810   PROTEINUR NEGATIVE 04/20/2016 1810   NITRITE NEGATIVE 04/20/2016 1810   LEUKOCYTESUR NEGATIVE 04/20/2016 1810   Sepsis Labs: '@LABRCNTIP'$ (procalcitonin:4,lacticidven:4)  ) Recent Results (from the past 240 hour(s))  Pleural  fluid culture w Gram Stain     Status: None (Preliminary result)   Collection Time: 08/20/21  9:06 PM   Specimen: Pleural Fluid  Result Value Ref Range Status   Specimen Description PLEURAL  Final   Special Requests NONE  Final   Gram Stain   Final    RARE WBC PRESENT, PREDOMINANTLY MONONUCLEAR NO ORGANISMS SEEN    Culture   Final    NO GROWTH 3 DAYS Performed at [facility], [place] [address], [place], [place] [address]    Report Status PENDING  Incomplete  Resp Panel by RT-PCR (Flu A&B, Covid) Nasopharyngeal Swab     Status: None   Collection Time: 08/21/21 12:36 PM   Specimen: Nasopharyngeal Swab; Nasopharyngeal(NP) swabs in vial transport medium  Result Value Ref Range Status   SARS Coronavirus 2 by RT PCR NEGATIVE NEGATIVE Final    Comment: (NOTE) SARS-CoV-2 target nucleic acids are NOT DETECTED.  The SARS-CoV-2 RNA is generally detectable in upper respiratory specimens during the acute phase of infection. The lowest concentration of SARS-CoV-2 viral copies this assay can detect is 138 copies/mL. A negative result does not preclude SARS-Cov-2 infection and should not be used as the sole basis for treatment or other patient management decisions. A negative result may occur with  improper specimen collection/handling, submission of specimen other than nasopharyngeal swab, presence of viral mutation(s) within the areas targeted by this assay, and inadequate number of viral copies(<138 copies/mL). A negative result must be combined with clinical observations, patient history, and epidemiological information. The expected result is Negative.  Fact Sheet for Patients:  EntrepreneurPulse.com.au  Fact Sheet for Healthcare Providers:  IncredibleEmployment.be  This test is no t yet approved or cleared by the [place] FDA and  has been authorized for detection and/or diagnosis of SARS-CoV-2 by FDA under an Emergency Use Authorization  (EUA). This EUA will remain  in effect (meaning this test can be used) for the duration of the COVID-19 declaration under Section 564(b)(1) of the Act, 21 U.S.C.section 360bbb-3(b)(1), unless the authorization is terminated  or revoked sooner.       Influenza A by PCR NEGATIVE NEGATIVE Final   Influenza B by PCR NEGATIVE NEGATIVE Final    Comment: (NOTE) The Xpert Xpress SARS-CoV-2/FLU/RSV plus assay is intended as an aid in the diagnosis of influenza from Nasopharyngeal swab specimens and should not be used as a sole basis for treatment. Nasal washings and aspirates are unacceptable for Xpert Xpress SARS-CoV-2/FLU/RSV testing.  Fact Sheet for Patients: EntrepreneurPulse.com.au  Fact Sheet for Healthcare Providers: IncredibleEmployment.be  This test is not yet approved or cleared by  the [employer] and has been authorized for detection and/or diagnosis of SARS-CoV-2 by FDA under an Emergency Use Authorization (EUA). This EUA will remain in effect (meaning this test can be used) for the duration of the COVID-19 declaration under Section 564(b)(1) of the Act, 21 U.S.C. section 360bbb-3(b)(1), unless the authorization is terminated or revoked.  Performed at [facility], [place] [address] [place], [place] [address]       Studies: DG CHEST PORT 1 VIEW  Result Date: 08/23/2021 CLINICAL DATA:  Chest tube present,pleural effusion ,sore chest EXAM: PORTABLE CHEST - 1 VIEW COMPARISON:  08/22/2021 FINDINGS: Right lateral pigtail chest tube stable position. Near complete evacuation of the previously noted right pleural effusion. There is a small apical pneumothorax, left lung apex projecting at the level of the posterior aspect right third rib. Improved of the previously noted dense consolidation in the right lower lung with residual patchy airspace infiltrates. Left lung remains clear. Heart size and mediastinal contours are within normal  limits. Visualized bones unremarkable. IMPRESSION: 1. Stable right chest tube with evacuation of pleural effusion, persistent small apical pneumothorax. 2. Improvement of the dense right lower lung consolidation with residual patchy airspace disease. Electronically Signed   By: Lucrezia Europe M.D.   On: 08/23/2021 09:19    Scheduled Meds:  amLODipine  5 mg Oral Daily   insulin aspart  0-5 Units Subcutaneous QHS   insulin aspart  0-9 Units Subcutaneous TID WC   insulin degludec  12 Units Subcutaneous QAC breakfast   irbesartan  150 mg Oral Daily   nicotine  21 mg Transdermal Daily    Continuous Infusions:   LOS: 1 day     Kayleen Memos, MD [facility] Pager [phone]  If 7PM-7AM, please contact night-coverage [email or web address] Password [facility] 08/23/2021, 7:21 PM

## 2021-08-24 ENCOUNTER — Inpatient Hospital Stay (HOSPITAL_COMMUNITY): Payer: BC Managed Care – PPO

## 2021-08-24 LAB — BODY FLUID CULTURE W GRAM STAIN: Culture: NO GROWTH

## 2021-08-24 LAB — CHOLESTEROL, BODY FLUID: Cholesterol, Fluid: 62 mg/dL

## 2021-08-24 LAB — IGG 4: IgG, Subclass 4: 36 mg/dL (ref 2–96)

## 2021-08-24 LAB — GLUCOSE, CAPILLARY: Glucose-Capillary: 91 mg/dL (ref 70–99)

## 2021-08-24 MED ORDER — LORAZEPAM 0.5 MG PO TABS
0.5000 mg | ORAL_TABLET | Freq: Once | ORAL | Status: AC
  Administered 2021-08-24: 0.5 mg via ORAL
  Filled 2021-08-24: qty 1

## 2021-08-24 MED ORDER — OXYCODONE-ACETAMINOPHEN 5-325 MG PO TABS: 1.0000 | ORAL_TABLET | Freq: Two times a day (BID) | ORAL | 0 refills | Status: AC | PRN

## 2021-08-24 MED ORDER — NICOTINE 21 MG/24HR TD PT24: 21.0000 mg | MEDICATED_PATCH | Freq: Every day | TRANSDERMAL | 0 refills | Status: AC

## 2021-08-24 MED ORDER — AMLODIPINE BESYLATE 5 MG PO TABS: 5.0000 mg | ORAL_TABLET | Freq: Every day | ORAL | 0 refills | Status: AC

## 2021-08-24 MED ORDER — LORAZEPAM 2 MG/ML IJ SOLN
1.0000 mg | Freq: Once | INTRAMUSCULAR | Status: AC
  Administered 2021-08-24: 1 mg via INTRAVENOUS
  Filled 2021-08-24: qty 1

## 2021-08-24 NOTE — Discharge Summary (Addendum)
Discharge Summary  Suzanne Weeks [id number] DOB: 1981-07-26  PCP: Curt Jews, PA-C  Admit date: 08/20/2021 Discharge date: 08/24/2021  Time spent: 35 minutes.  Recommendations for Outpatient Follow-up:  Follow-up with GI Follow-up with pulmonary Follow-up with PCP Take your medications as prescribed Completely abstain from tobacco use If you develop shortness of breath, please go to the emergency room.  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  Work-up done concerning for large right pleural effusion.  CT angiogram chest done negative for PE however showed a large right-sided pleural effusion with atelectasis of the right lung and mediastinal shift.  EDP performed ultrasound guided diagnostic and therapeutic thoracentesis with approximately 1 L of serosanguineous fluid removed on 08/20/2021.  Follow-up chest x-ray showed persistent large residual right pleural effusion without a pneumothorax.  PCCM was consulted and right chest tube was placed.   Pleural fluid analysis from 9/5 showed transudative effusion with elevated amylase.  GI was consulted for further evaluation.  Patient denies any GI symptoms except for weight loss 60 pounds within 6 months last year, which she attributed to her newly diagnosed type I diabetes.  She denies any abdominal pain, nausea or vomiting.  She denied diarrhea or constipation.  She had a GI work-up at [facility].  Her CEA and CA 19-9 were normal.  On 08/22/2021 she had a CT abdomen and pelvis with contrast that showed enlarging cyst at the pancreatic neck and body measuring 5.3 cm.  Cyst measured around 3.4 cm in May 2021.  She had an MRCP done because of enlarging pancreatic cyst.  GI recommended outpatient EUS for possible cyst aspiration and fluid analysis.  08/24/2021: Seen in the room, no acute distress, ambulating with chest tube in place.  She had a repeated chest x-ray this morning which showed improvement of right pleural effusion and trace right apical pneumothorax, slightly decreased from prior exam.  Slightly decreased patchy opacities in the right lower lung.  She was seen by PCCM and her chest tube was removed for DC planning.  GI, Dr. Alessandra Bevels, discussed case with Dr. Delrae Alfred at [facility] to arrange for outpatient EUS, also discussed with PCCM, Dr. Erin Fulling.  Per PCCM she has appointment with Dr. Delrae Alfred Monday morning (08/27/2021) for EUS and ERCP.  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 -Patient presented with worsening shortness of breath.  Chest x-ray  consistent with a large right-sided pleural effusion.  CT angiogram chest done negative for PE however showed a large right-sided pleural effusion with atelectasis of the right lung and mediastinal shift to the left, narrowed appearance of right upper lower lobe bronchi, findings consistent with chronic pancreatitis. -Body fluids with amylase level of 7141, glucose of 159, LDH 83, few mesothelial cells.  Pleural fluid cultures negative. -Abdominal ultrasound with no cholelithiasis identified, borderline enlargement of CBD with no obstructing lesion identified, echogenic lesion in the liver measuring up to 1.3 cm unchanged since 2017 likely benign lesion such as hemangioma, right pleural effusion. -Post thoracentesis on 08/20/2021, chest x-ray with with large residual right pleural fluid, no pneumothorax. -Due to large residual right pleural fluid despite thoracentesis PCCM was consulted for further evaluation and management. Right chest tube was placed on 08/22/2021 by PCCM with 1.4 L fluid out.  Fluid sent for analysis. Chest x-ray 08/23/2021 showed improvement of right pleural effusion and small apical pneumothorax.  Improvement of the dense right lower lung consolidation with residual patchy airspace disease. Repeat a chest x-ray on 08/24/2021 showed stable right chest tube position with decreased right apical pneumothorax, now trace.  Previously noted pleural effusion is now trace.  Slight improvement in right lower lung patchy opacity disease. Chest tube drained 2.1 L since placement from 08/22/2021-08/23/2021. Follow-up with pulmonary. Recommend complete tobacco cessation.   Leukocytosis, likely reactive in the setting of chest tube placement Procalcitonin negative Afebrile No clear evidence of active infective process. Follow-up with your PCP and repeat CBC outpatient.  Insulin-dependent diabetes mellitus type I with hyperglycemia -Hemoglobin A1c 5.1 Continue home regimen.  Hypertension BP is at  goal Continue irbesartan and amlodipine 5 mg daily. Follow-up with your PCP.  Tobacco use disorder Tobacco use since the age of 21 Recommend complete tobacco cessation -Nicotine patch.  Chronic pancreatitis with history of pseudocyst -Currently asymptomatic. Seen by GI, MRCP again showed extensive chronic pancreatic calcification as well as pancreatic pseudocyst in the pancreatic body.  It also showed questionable ileus but patient does not have any nausea or vomiting.  Resolved post repletion: Hypokalemia Potassium 4.5.   Mild non-anion gap metabolic acidosis Serum bicarb 18, anion gap 9 Maintain adequate oral intake.  Abnormal TSH -TSH at 7.865. -Free T4 within normal limits at 1.11 -Will need repeat thyroid function studies done in the outpatient setting in 6 to 8 weeks. Follow-up with your PCP.      Code Status: Full  Consultants:  PCCM GI   Procedures:  Ultrasound-guided thoracentesis by EDP 08/20/2021 CT angiogram chest 08/20/2021 Chest x-ray 08/20/2021 Abdominal ultrasound 08/21/2021 Chest tube placement 08/22/2021 by PCCM      Antimicrobials:  None    Discharge Exam: BP 132/77 (BP Location: Right Arm)   Pulse (!) 59   Temp 98 F (36.7 C) (Oral)   Resp 16   Ht '5\' 5"'$  (1.651 m)   Wt 57.2 kg   SpO2 100%   BMI 20.97 kg/m  General: 40 y.o. year-old female well developed well nourished in no acute distress.  Alert and oriented x3. Cardiovascular: Regular rate and rhythm with no rubs or gallops.  No thyromegaly or JVD noted.   Respiratory: Clear to auscultation with no wheezes or rales. Good inspiratory effort. Abdomen: Soft nontender nondistended with normal bowel sounds x4 quadrants.  Musculoskeletal: No lower extremity edema. 2/4 pulses in all 4 extremities. Skin: No ulcerative lesions noted or rashes, Psychiatry: Mood is appropriate for condition and setting  Discharge Instructions You were cared for by a hospitalist during your hospital stay. If you have  any questions about your discharge medications or the care you received while you were in the hospital after you are discharged, you can call the unit and asked to speak with the hospitalist on call if the hospitalist that took care of you is not available. Once you are discharged, your primary care physician will handle any further medical issues. Please note that NO REFILLS for any discharge medications will be authorized once you are discharged, as it is imperative that you return to your primary care physician (or establish a relationship with a primary care physician if you do not have one) for your aftercare needs so that they can reassess your need for medications and monitor your lab values.   Allergies as of 08/24/2021   No Known Allergies      Medication List     TAKE these medications    amLODipine 5 MG tablet Commonly known as: NORVASC Take 1 tablet (5 mg total) by mouth daily. Start taking on: August 25, 2021   ascorbic acid 500 MG tablet Commonly known as: VITAMIN C Take 1,000 mg by mouth daily.   insulin degludec 100 UNIT/ML FlexTouch Pen Commonly known as: TRESIBA Inject 0.1 mLs (10 Units total) into the skin daily. What changed: how much to take   irbesartan 150 MG tablet Commonly known as: AVAPRO Take 150 mg by mouth daily.   multivitamin with minerals Tabs tablet Take 1 tablet by mouth daily.   naproxen sodium 220 MG tablet Commonly known as: ALEVE Take 220 mg by mouth daily as needed (pain).   nicotine 21 mg/24hr patch Commonly known as: NICODERM CQ - dosed in mg/24 hours Place 1 patch (21 mg total) onto the skin daily. Start taking on: August 25, 2021   NovoLOG FlexPen 100 UNIT/ML FlexPen Generic drug: insulin aspart Inject 1-4 Units into the skin 3 (three) times daily before meals. Sliding scale   oxyCODONE-acetaminophen 5-325 MG tablet Commonly known as: PERCOCET/ROXICET Take 1 tablet by mouth 2 (two) times daily as needed for up to 5 days  for severe pain (use in between toradol dosing).   vitamin B-12 500 MCG tablet Commonly known as: CYANOCOBALAMIN Take 1 tablet (500 mcg total) by mouth daily.   Vitamin D (Ergocalciferol) 1.25 MG (50000 UNIT) Caps capsule Commonly known as: DRISDOL Take 50,000 Units by mouth once a week.       No Known Allergies  Follow-up Information     Harley Hallmark, MD Follow up.   Why: September 18, 2021 at 0930 am Contact information: [place] [address] [phone]         Curt Jews, PA-C. Call today.   Specialty: Physician Assistant Why: Please call for a post hospital follow-up appointment. Contact information: [id number] [place] [address] [phone]         Freddi Starr, MD. Call today.   Specialty: Pulmonary Disease Why: Please call for a posthospital follow-up appointment. Contact information: [address] [place] [address] [place]  [address] [phone]                  The results of significant diagnostics from this hospitalization (including imaging, microbiology, ancillary and laboratory) are listed below for  reference.    Significant Diagnostic Studies: CT Angio Chest PE W and/or Wo Contrast  Result Date: 08/20/2021 CLINICAL DATA:  Shortness of breath EXAM: CT ANGIOGRAPHY CHEST WITH CONTRAST TECHNIQUE: Multidetector CT imaging of the chest was performed using the standard protocol during bolus administration of intravenous contrast. Multiplanar CT image reconstructions and MIPs were obtained to evaluate the vascular anatomy. CONTRAST:  56m OMNIPAQUE IOHEXOL 350 MG/ML SOLN COMPARISON:  Chest x-ray 08/20/2021 FINDINGS: Cardiovascular: Satisfactory opacification of the pulmonary arteries to the segmental level. No evidence of pulmonary embolism. Normal cardiac size. Trace pericardial effusion. Mediastinum/Nodes: Shift of mediastinal contents to the left. Leftward deviation of trachea and esophagus.  No grossly enlarged mediastinal lymph nodes. No thyroid mass Lungs/Pleura: Large left-sided right pleural effusion with atelectasis of the right lung. Small amount of aerated right upper lobe. Narrowed appearing right upper and lower lobe bronchi. Upper Abdomen: Numerous pancreatic calcifications consistent with chronic pancreatitis. No acute abnormality Musculoskeletal: No chest wall abnormality. No acute or significant osseous findings. Review of the MIP images confirms the above findings. IMPRESSION: 1. Negative for acute pulmonary embolus. 2. Large right-sided pleural effusion with atelectasis of right lung and shift of mediastinal contents to the left. Narrowed appearance of right upper and lower lobe bronchi. 3. Findings consistent with chronic pancreatitis Electronically Signed   By: KDonavan FoilM.D.   On: 08/20/2021 19:23   CT ABDOMEN PELVIS W CONTRAST  Result Date: 08/22/2021 CLINICAL DATA:  Persistent pancreatitis EXAM: CT ABDOMEN AND PELVIS WITH CONTRAST TECHNIQUE: Multidetector CT imaging of the abdomen and pelvis was performed using the standard protocol following bolus administration of intravenous contrast. CONTRAST:  824mOMNIPAQUE IOHEXOL 350 MG/ML SOLN, additional oral enteric contrast COMPARISON:  04/24/2020 FINDINGS: Lower chest: Small right pleural effusion and heterogeneous airspace opacity of the right lung base. Hepatobiliary: No solid liver abnormality is seen. No gallstones, gallbladder wall thickening, or biliary dilatation. Pancreas: Severe, extensive calcification of the pancreatic parenchyma and diffuse prominence of the pancreatic duct. Superior to the pancreatic neck and body, and underlying the left hemidiaphragm, there is an elongated fluid collection interposed between the left adrenal gland and the lesser curvature of the stomach, measuring approximately 5.3 x 1.0 x 0.8 cm (series 3, image 15, series 6, image 43). This is in the vicinity of previously identified pseudocyst or  acute pancreatic fluid collection on examination dated 04/24/2020. Spleen: Normal in size without significant abnormality. Adrenals/Urinary Tract: Adrenal glands are unremarkable. Kidneys are normal, without renal calculi, solid lesion, or hydronephrosis. Bladder is unremarkable. Stomach/Bowel: Stomach is within normal limits. Appendix appears normal. No evidence of bowel wall thickening, distention, or inflammatory changes. Vascular/Lymphatic: No significant vascular findings are present. No enlarged abdominal or pelvic lymph nodes. Reproductive: Status post hysterectomy. Other: No abdominal wall hernia or abnormality. Small volume free fluid in the low pelvis (series 3, image 69). Musculoskeletal: No acute or significant osseous findings. IMPRESSION: 1. Severe, extensive calcification of the pancreatic parenchyma and diffuse prominence of the pancreatic duct, consistent with chronic stigmata of pancreatitis. There are no overt inflammatory findings of the pancreatic parenchyma at this time. 2. Superior to the pancreatic neck and body, and underlying the left hemidiaphragm, there is an elongated fluid collection interposed between the left adrenal gland and the lesser curvature of the stomach, measuring approximately 5.3 x 1.0 x 0.8 cm. This is in the vicinity of previously identified pseudocyst or acute pancreatic fluid collection on examination dated 04/24/2020. Findings are again consistent with pseudocyst or acute pancreatic fluid collection.  3. Small right pleural effusion and heterogeneous airspace opacity of the right lung base, consistent with infection or aspiration. 4. Small volume free fluid in the low pelvis, likely reactive. 5. Status post hysterectomy. Electronically Signed   By: Eddie Candle M.D.   On: 08/22/2021 13:15   DG CHEST PORT 1 VIEW  Result Date: 08/24/2021 CLINICAL DATA:  Pleural effusion, right-sided chest tube EXAM: PORTABLE CHEST 1 VIEW COMPARISON:  08/23/2021 FINDINGS: Right lateral  pigtail catheter in stable position overlying the right hemithorax. Previously noted pleural effusion is now trace. Trace right apical pneumothorax, slightly decreased from prior exam. No left pneumothorax. Slightly decreased patchy opacities in the right lower lung. The left lung is clear. Normal cardiac and mediastinal contours. No acute osseous abnormality. IMPRESSION: 1. Stable right chest tube position with decreased right apical pneumothorax, now trace. 2. Slight improvement in right lower lung patchy airspace disease. Electronically Signed   By: Merilyn Baba M.D.   On: 08/24/2021 11:23   DG CHEST PORT 1 VIEW  Result Date: 08/23/2021 CLINICAL DATA:  Chest tube present,pleural effusion ,sore chest EXAM: PORTABLE CHEST - 1 VIEW COMPARISON:  08/22/2021 FINDINGS: Right lateral pigtail chest tube stable position. Near complete evacuation of the previously noted right pleural effusion. There is a small apical pneumothorax, left lung apex projecting at the level of the posterior aspect right third rib. Improved of the previously noted dense consolidation in the right lower lung with residual patchy airspace infiltrates. Left lung remains clear. Heart size and mediastinal contours are within normal limits. Visualized bones unremarkable. IMPRESSION: 1. Stable right chest tube with evacuation of pleural effusion, persistent small apical pneumothorax. 2. Improvement of the dense right lower lung consolidation with residual patchy airspace disease. Electronically Signed   By: Lucrezia Europe M.D.   On: 08/23/2021 09:19   DG CHEST PORT 1 VIEW  Result Date: 08/22/2021 CLINICAL DATA:  Right pleural effusion. EXAM: PORTABLE CHEST 1 VIEW COMPARISON:  August 20, 2021. FINDINGS: The heart size and mediastinal contours are within normal limits. Left lung is clear. Moderate-sized right pleural effusion is noted which is slightly decreased compared to prior exam. Interval placement of right-sided pigtail pleural drainage  catheter. No pneumothorax is noted. The visualized skeletal structures are unremarkable. IMPRESSION: Moderate-size right pleural effusion is noted which is slightly decreased since pigtail pleural drainage catheter placement. Electronically Signed   By: Marijo Conception M.D.   On: 08/22/2021 10:30   DG Chest Portable 1 View  Result Date: 08/20/2021 CLINICAL DATA:  Status post right thoracentesis EXAM: PORTABLE CHEST 1 VIEW COMPARISON:  4:55 p.m. FINDINGS: Large right pleural effusion persists with collapse of the right middle and lower lobes and compressive atelectasis of the right upper lobe, slightly improved since prior examination. Left lung is clear. No pneumothorax. No pleural effusion on the left. Cardiac size within normal limits. IMPRESSION: Status post right thoracentesis with large residual right pleural fluid. No pneumothorax. Electronically Signed   By: Fidela Salisbury M.D.   On: 08/20/2021 21:58   DG Chest Portable 1 View  Result Date: 08/20/2021 CLINICAL DATA:  Shortness of breath.  Pleural effusion. EXAM: PORTABLE CHEST 1 VIEW COMPARISON:  Chest radiographs 07/01/2020. No recent prior studies available. FINDINGS: 1655 hours. There is a very large right pleural effusion with near complete opacification of the right hemithorax and mild mediastinal shift to the left. The heart size appears stable. The left lung is clear. There is no pneumothorax. The bones appear unchanged. Telemetry leads overlie the  chest. There are calcifications in the left upper quadrant of the abdomen suggesting chronic calcific pancreatitis. This was seen on prior abdominal CT 04/24/2020. IMPRESSION: Very large right pleural effusion of with resulting subtotal right lung collapse and mediastinal shift to the left. Electronically Signed   By: Richardean Sale M.D.   On: 08/20/2021 17:16   MR ABDOMEN MRCP W WO CONTAST  Result Date: 08/24/2021 CLINICAL DATA:  Chronic pancreatitis, recent rhinorrhea and shortness of breath.  EXAM: MRI ABDOMEN WITHOUT AND WITH CONTRAST (INCLUDING MRCP) TECHNIQUE: Multiplanar multisequence MR imaging of the abdomen was performed both before and after the administration of intravenous contrast. Heavily T2-weighted images of the biliary and pancreatic ducts were obtained, and three-dimensional MRCP images were rendered by post processing. CONTRAST:  5.22m GADAVIST GADOBUTROL 1 MMOL/ML IV SOLN COMPARISON:  CT abdomen 08/22/2021 FINDINGS: Despite efforts by the technologist and patient, motion artifact is present on today's exam and could not be eliminated. This reduces exam sensitivity and specificity. Lower chest: Airspace opacity and consolidation in the right middle lobe and right lower lobe suspicious for pneumonia or aspiration pneumonitis. Small right basilar pneumothorax anteriorly. Hepatobiliary: No appreciable biliary dilatation. No well-defined gallstones identified. 0.6 by 0.5 cm cyst in the lateral segment left hepatic lobe on image 11 of series 20. A 0.7 by 0.4 cm lesion in the dome of the lateral segment left hepatic lobe on image 19 series 4 is somewhat blurred by motion artifact probably also a cyst, less likely a small hemangioma. Pancreas: Thinned pancreatic parenchyma with dilated dorsal pancreatic duct and known extensive parenchymal in duct calcifications on this and prior imaging. The appearance is likely a manifestation of chronic calcific pancreatitis. No specific abnormal enhancing mass is observed. As appreciated on CT, there is a small tubular fluid collection tracking along the lesser sac and above the pancreatic body measuring about 7.2 by 0.7 by 0.7 cm, without definite connection to the dorsal pancreatic duct. This is sharply contained and most compatible with a small pseudocyst in this clinical context. Although there is some marginal enhancement along this lesion, I am skeptical of an abscess given the lack of surrounding inflammatory stranding, and no gas is observed in this  lesion. Spleen:  Unremarkable Adrenals/Urinary Tract:  Unremarkable Stomach/Bowel: Scattered mildly dilated loops of small bowel with scattered air-fluid levels in the upper abdomen, local ileus is a distinct possibility. There is formed stool in the colon and I do not see an obvious transition point in the small bowel. 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 Mild atheromatous plaque in the abdominal aorta, not calcified on recent CT. Other:  No supplemental non-categorized findings. Musculoskeletal: Unremarkable IMPRESSION: 1. Airspace opacity/consolidation in the right middle lobe and right lower lobe suspicious for pneumonia or aspiration. Small right basilar pneumothorax, as shown on chest radiograph of 08/23/2021. 2. Extensive findings of chronic calcific pancreatitis, with a small tubular pseudocyst extending above the pancreatic body towards the lesser sac. 3. Mildly dilated loops of small bowel with scattered air-levels in the upper abdomen. Although nonspecific, local ileus is a distinct possibility. No transition point identified to indicate small bowel obstruction. 4. 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 5. Minimal atheromatous plaque in the abdominal aorta. 6. Small benign-appearing lesions in the dome of the lateral segment left hepatic lobe. Electronically Signed   By: WVan ClinesM.D.   On: 08/24/2021 08:11   UKoreaAbdomen Limited RUQ (LIVER/GB)  Result Date: 08/21/2021 CLINICAL DATA:  Pancreatitis EXAM: ULTRASOUND ABDOMEN LIMITED RIGHT UPPER QUADRANT COMPARISON:  CTA chest 08/20/2021, CT abdomen/pelvis 04/29/2013 abdominal ultrasound 04/20/2016 FINDINGS: Gallbladder: No gallstones or wall thickening visualized. No sonographic Murphy sign noted by sonographer. Common bile duct: Diameter: 6 mm Liver: There is an echogenic area in the right hepatic  lobe measuring 0.7 cm x 1.4 cm x 0.7 cm. Parenchymal echogenicity is otherwise normal. Portal vein is patent on color Doppler imaging with normal direction of blood flow towards the liver. Other: There is a right pleural effusion. IMPRESSION: 1. No cholelithiasis identified. Borderline enlargement of the common bile duct with no obstructing lesion identified. 2. Echogenic lesion in the liver measuring up to 1.3 cm is unchanged since 2017. Given stability, this likely reflects a benign lesion such as a hemangioma. 3. Right pleural effusion. Electronically Signed   By: Valetta Mole M.D.   On: 08/21/2021 10:22    Microbiology: Recent Results (from the past 240 hour(s))  Pleural fluid culture w Gram Stain     Status: None   Collection Time: 08/20/21  9:06 PM   Specimen: Pleural Fluid  Result Value Ref Range Status   Specimen Description PLEURAL  Final   Special Requests NONE  Final   Gram Stain   Final    RARE WBC PRESENT, PREDOMINANTLY MONONUCLEAR NO ORGANISMS SEEN    Culture   Final    NO GROWTH Performed at [facility], [place] [address], [place], [place] [address]    Report Status 08/24/2021 FINAL  Final  Resp Panel by RT-PCR (Flu A&B, Covid) Nasopharyngeal Swab     Status: None   Collection Time: 08/21/21 12:36 PM   Specimen: Nasopharyngeal Swab; Nasopharyngeal(NP) swabs in vial transport medium  Result Value Ref Range Status   SARS Coronavirus 2 by RT PCR NEGATIVE NEGATIVE Final    Comment: (NOTE) SARS-CoV-2 target nucleic acids are NOT DETECTED.  The SARS-CoV-2 RNA is generally detectable in upper respiratory specimens during the acute phase of infection. The lowest concentration of SARS-CoV-2 viral copies this assay can detect is 138 copies/mL. A negative result does not preclude SARS-Cov-2 infection and should not be used as the sole basis for treatment or other patient management decisions. A negative result may occur with  improper specimen collection/handling, submission  of specimen other than nasopharyngeal swab, presence of viral mutation(s) within the areas targeted by this assay, and inadequate number of viral copies(<138 copies/mL). A negative result must be combined with clinical observations, patient history, and epidemiological information. The expected result is Negative.  Fact Sheet for Patients:  EntrepreneurPulse.com.au  Fact Sheet for Healthcare Providers:  IncredibleEmployment.be  This test is no t yet approved or cleared by the [place] FDA and  has been authorized for detection and/or diagnosis of SARS-CoV-2 by FDA under an Emergency Use Authorization (EUA). This EUA will remain  in effect (meaning this test can be used) for the duration of the COVID-19 declaration under Section 564(b)(1) of the Act, 21 U.S.C.section 360bbb-3(b)(1), unless the authorization is terminated  or revoked sooner.       Influenza A by PCR NEGATIVE NEGATIVE Final   Influenza B by PCR NEGATIVE NEGATIVE Final    Comment: (NOTE) The Xpert Xpress SARS-CoV-2/FLU/RSV plus assay is intended as an aid in the diagnosis of influenza from Nasopharyngeal swab specimens and should not be used as a sole basis for treatment. Nasal washings and aspirates are unacceptable for Xpert Xpress SARS-CoV-2/FLU/RSV testing.  Fact Sheet for  Patients: EntrepreneurPulse.com.au  Fact Sheet for Healthcare Providers: IncredibleEmployment.be  This test is not yet approved or cleared by the [place] FDA and has been authorized for detection and/or diagnosis of SARS-CoV-2 by FDA under an Emergency Use Authorization (EUA). This EUA will remain in effect (meaning this test can be used) for the duration of the COVID-19 declaration under Section 564(b)(1) of the Act, 21 U.S.C. section 360bbb-3(b)(1), unless the authorization is terminated or revoked.  Performed at [facility], [place] [address]  [place], [place] [address]      Labs: Basic Metabolic Panel: Recent Labs  Lab 08/20/21 1634 08/21/21 0500 08/23/21 0059  NA 138 136 132*  K 5.2* 3.4* 4.5  CL 110 109 105  CO2 23 22 18*  GLUCOSE 216* 207* 219*  BUN '13 10 12  '$ CREATININE 1.05* 1.00 1.07*  CALCIUM 9.0 8.9 8.6*   Liver Function Tests: Recent Labs  Lab 08/20/21 1634 08/23/21 0059  AST 47* 23  ALT 37 17  ALKPHOS 80 58  BILITOT 1.0 1.0  PROT 5.7* 5.1*  ALBUMIN 3.2* 2.6*   Recent Labs  Lab 08/21/21 0500  AMYLASE 301*   No results for input(s): AMMONIA in the last 168 hours. CBC: Recent Labs  Lab 08/20/21 1634 08/23/21 0059  WBC 7.8 13.3*  HGB 11.9* 11.5*  HCT 33.0* 31.6*  MCV 97.9 97.8  PLT 355 292   Cardiac Enzymes: No results for input(s): CKTOTAL, CKMB, CKMBINDEX, TROPONINI in the last 168 hours. BNP: BNP (last 3 results) Recent Labs    08/23/21 0059  BNP 256.9*    ProBNP (last 3 results) No results for input(s): PROBNP in the last 8760 hours.  CBG: Recent Labs  Lab 08/21/21 0742 08/21/21 2138 08/23/21 2106 08/24/21 0602  GLUCAP 180* 392* 112* 91       Signed:  Kayleen Memos, MD [facility] 08/24/2021, 3:52 PM

## 2021-08-24 NOTE — Plan of Care (Signed)

## 2021-08-24 NOTE — Progress Notes (Signed)
[facility] Gastroenterology Progress Note  Suzanne Weeks 40 y.o. 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  She is feeling better.  She denies any abdominal pain.  Shortness of breath is improved significantly.  Denies nausea and vomiting.  ROS : Afebrile, negative for chest pain   Objective: Vital signs in last 24 hours: Vitals:   08/24/21 0508 08/24/21 0901  BP: 121/81 118/72  Pulse: 69 61  Resp: 16   Temp: 98.5 F (36.9 C) 97.8 F (36.6 C)  SpO2: 99% 100%    Physical Exam:  General:  Alert, cooperative, no distress, appears stated age  Head:  Normocephalic, without obvious abnormality, atraumatic  Eyes:  , EOM's intact,   Lungs:   No visible respiratory distress.  Right-sided chest tube noted.  Heart:  Regular rate and rhythm, S1, S2 normal  Abdomen:   Soft, non-tender, nondistended, bowel sounds present.  No peritoneal sign  Extremities: Extremities normal, atraumatic, no  edema  Pulses: 2+ and symmetric    Lab Results: Recent Labs    08/23/21 0059  NA 132*  K 4.5  CL 105  CO2 18*  GLUCOSE 219*  BUN 12  CREATININE 1.07*  CALCIUM 8.6*   Recent Labs    08/23/21 0059  AST 23  ALT 17  ALKPHOS 58  BILITOT 1.0  PROT 5.1*  ALBUMIN 2.6*   Recent Labs    08/23/21 0059  WBC 13.3*  HGB 11.5*  HCT 31.6*  MCV 97.8  PLT 292   No results for input(s): LABPROT, INR in the last 72 hours.    Assessment/Plan: -Right-sided pleural effusion in a patient with history of chronic pancreatitis.  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 -History of chronic pancreatitis with initial episode of pancreatitis in 2014.  Etiology undetermined.  Could be from ongoing smoking and alcohol use.  Recommendations -------------------------- -Case was discussed with Dr. Rush Landmark yesterday.  Initial plan was to do EUS as inpatient on Monday.  Patient does not want to stay in hospital till Monday  and requesting discharge today. 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  It also showed questionable ileus but patient does not have any nausea or vomiting.  -I have discussed case with Dr. Delrae Alfred at [facility] to arrange for outpatient EUS.  Also discussed with Dr. Erin Fulling   -Patient was also advised to contact [place] o arrange for outpatient EUS done in the next few weeks.  -GI will sign off.  Call us back if needed.   Otis Brace MD, FACP 08/24/2021, 9:14 AM  Contact #  [phone]

## 2021-08-24 NOTE — Progress Notes (Signed)
Avs given and explained to patient, Home meds from pharmacy returned.
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
## 2021-08-24 NOTE — Plan of Care (Signed)

## 2021-08-28 ENCOUNTER — Telehealth: Payer: Self-pay | Admitting: Pulmonary Disease

## 2021-08-28 DIAGNOSIS — J90 Pleural effusion, not elsewhere classified: Secondary | ICD-10-CM

## 2021-08-28 NOTE — Telephone Encounter (Signed)
Noted.  Will close encounter.  

## 2021-08-28 NOTE — Telephone Encounter (Signed)
Pt is not established in our practice but did see JD in hospital last week. GI department advised her to do a chest xray with our practice prior to her appt with them on the 19th. No orders are visable in her chart. Routing as high prioirty since she was in the hospital recently. Please advise.

## 2021-08-28 NOTE — Telephone Encounter (Signed)
Called patient but she did not answer. Left message for patient to call back. I have blocked the 130pm slot on 09/29. Patient can be scheduled when she calls back.   CXR order has been placed.

## 2021-08-28 NOTE — Telephone Encounter (Signed)
Called and spoke with pt and she stated that she was in the hospital and she has an appt with GI--Dr. Carlis Abbott on Monday in [place].  They advised her to call our office and have a cxr here in the office so that this is in her chart prior to her visit on Monday.  JD did see her while she was in the hospital but she is not established with our office.  JD please advise. Thanks

## 2021-08-28 NOTE — Telephone Encounter (Signed)
Please place order for 2 view chest radiograph so that the patient has a standing order to come in and have that done at her convenience prior to her visit with GI. She will likely come in this Friday to have that done.  Please schedule her a hospital f/u visit 9/29 at 1:30 pm. She can be double booked at that time as the patient currently scheduled there does not need that follow up appointment and Cherina will be cancelling it.  Thanks, Wille Glaser

## 2021-08-28 NOTE — Telephone Encounter (Signed)
I have made this appt

## 2021-08-31 ENCOUNTER — Ambulatory Visit (INDEPENDENT_AMBULATORY_CARE_PROVIDER_SITE_OTHER): Payer: BC Managed Care – PPO

## 2021-08-31 DIAGNOSIS — J90 Pleural effusion, not elsewhere classified: Secondary | ICD-10-CM

## 2021-09-13 ENCOUNTER — Encounter: Payer: Self-pay | Admitting: Pulmonary Disease

## 2021-09-13 ENCOUNTER — Other Ambulatory Visit: Payer: Self-pay

## 2021-09-13 ENCOUNTER — Ambulatory Visit: Payer: BC Managed Care – PPO | Admitting: Pulmonary Disease

## 2021-09-13 VITALS — BP 126/82 | HR 88 | Ht 65.0 in | Wt 125.4 lb

## 2021-09-13 DIAGNOSIS — J90 Pleural effusion, not elsewhere classified: Secondary | ICD-10-CM | POA: Diagnosis not present

## 2021-09-13 NOTE — Patient Instructions (Signed)
You are safe to return to work from a pulmonary standpoint.   Please call us in the future if there are any concerns with the follow up chest x-ray and CT scans.   We can arrange a tele-visit or in person visit as needed.

## 2021-09-13 NOTE — Progress Notes (Signed)
Synopsis: Referred in September 2022 for Pleural effusion  Subjective:   PATIENT ID: Suzanne Weeks GENDER: female DOB: Dec 14, 1981, MRN: [id number]   HPI  Chief Complaint  Patient presents with   Hospitalization Follow-up    HFU for pleural effusion.     Suzanne Weeks is a 40 year old woman, daily smoker with chronic pancreatitis and DMI who was admitted 9/6 to 9/9 for dyspnea and pleural effusion who presents to pulmonary clinic for hospital follow up.   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 Her pleural effusion was drainaged via pigtail chest tube catheter after initially having a thoracentesis done. Follow up chest radiograph on 9/16 showed small right effusion.   She was evaluated by GI at [facility] and underwent ERCP on 9/12 with biliary sphincterotomy performed but PD canulation was not achieved due to significant stone burden of the pancreas. 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 She was then evaluated by Dr. Willa Frater of surgery with no recommendation for surgery at this time to treat the pancreatopleural fistula.   She reports her breathing has been doing well since discharge and evaluation by GI and the surgical services at [facility]. She has been able to resume her regular physical activity and has not noted a return of shortness of breath.   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 ([place]) 03/2020   chronic pancreatitis with pseudocyst formation   Uterine fibroid      Family History  Problem Relation Age of Onset   Diabetes Father      Social History   Socioeconomic History    Marital status: Single    Spouse name: Not on file   Number of children: Not on file   Years of education: Not on file   Highest education level: Not on file  Occupational History   Not on file  Tobacco Use   Smoking status: Every Day    Packs/day: 0.50    Types: Cigarettes   Smokeless tobacco: Never  Substance and Sexual Activity   Alcohol use: Not Currently    Comment: quit drinking 2014   Drug use: Not on file   Sexual activity: Not on file  Other Topics Concern   Not on file  Social History Narrative   Not on file   Social Determinants of Health   Financial Resource Strain: Not on file  Food Insecurity: Not on file  Transportation Needs: Not on file  Physical Activity: Not on file  Stress: Not on file  Social Connections: Not on file  Intimate Partner Violence: Not on file     No Known Allergies   Outpatient Medications Prior to Visit  Medication Sig Dispense Refill   amLODipine (NORVASC) 5 MG tablet Take 1 tablet (5 mg total) by mouth daily. 90 tablet 0   ascorbic acid (VITAMIN C) 500 MG tablet Take 1,000 mg by mouth daily.     CREON 24000-76000 units CPEP Take 1 capsule by mouth 3 (three) times daily.     insulin aspart (NOVOLOG FLEXPEN) 100 UNIT/ML FlexPen Inject 1-4 Units into the skin 3 (three) times daily before meals. Sliding scale     insulin  degludec (TRESIBA) 100 UNIT/ML FlexTouch Pen Inject 0.1 mLs (10 Units total) into the skin daily. (Patient taking differently: Inject 12 Units into the skin daily.)     irbesartan (AVAPRO) 150 MG tablet Take 150 mg by mouth daily.     Multiple Vitamin (MULTIVITAMIN WITH MINERALS) TABS tablet Take 1 tablet by mouth daily.     naproxen sodium (ALEVE) 220 MG tablet Take 220 mg by mouth daily as needed (pain).     nicotine (NICODERM CQ - DOSED IN MG/24 HOURS) 21 mg/24hr patch Place 1 patch (21 mg total) onto the skin daily. 28 patch 0   vitamin B-12 (CYANOCOBALAMIN) 500 MCG tablet Take 1 tablet (500 mcg total) by mouth  daily. 60 tablet 3   Vitamin D, Ergocalciferol, (DRISDOL) 1.25 MG (50000 UNIT) CAPS capsule Take 50,000 Units by mouth once a week.     No facility-administered medications prior to visit.    Review of Systems  Constitutional:  Negative for chills, fever, malaise/fatigue and weight loss.  HENT:  Negative for congestion, sinus pain and sore throat.   Eyes: Negative.   Respiratory:  Negative for cough, hemoptysis, sputum production, shortness of breath and wheezing.   Cardiovascular:  Negative for chest pain, palpitations, orthopnea, claudication and leg swelling.  Gastrointestinal:  Negative for abdominal pain, heartburn, nausea and vomiting.  Genitourinary: Negative.   Musculoskeletal:  Negative for joint pain and myalgias.  Skin:  Negative for rash.  Neurological:  Negative for weakness.  Endo/Heme/Allergies: Negative.   Psychiatric/Behavioral: Negative.       Objective:   Vitals:   09/13/21 1340  BP: 126/82  Pulse: 88  SpO2: 100%  Weight: 125 lb 6.4 oz (56.9 kg)  Height: 5\' 5"  (1.651 m)     Physical Exam Constitutional:      General: She is not in acute distress.    Appearance: She is not ill-appearing.  HENT:     Head: Normocephalic and atraumatic.  Eyes:     General: No scleral icterus.    Conjunctiva/sclera: Conjunctivae normal.     Pupils: Pupils are equal, round, and reactive to light.  Cardiovascular:     Rate and Rhythm: Normal rate and regular rhythm.     Pulses: Normal pulses.     Heart sounds: Normal heart sounds. No murmur heard. Pulmonary:     Effort: Pulmonary effort is normal.     Breath sounds: Normal breath sounds. No wheezing, rhonchi or rales.  Abdominal:     General: Bowel sounds are normal.     Palpations: Abdomen is soft.  Musculoskeletal:     Right lower leg: No edema.     Left lower leg: No edema.  Lymphadenopathy:     Cervical: No cervical adenopathy.  Skin:    General: Skin is warm and dry.  Neurological:     General: No focal  deficit present.     Mental Status: She is alert.  Psychiatric:        Mood and Affect: Mood normal.        Behavior: Behavior normal.        Thought Content: Thought content normal.        Judgment: Judgment normal.      CBC    Component Value Date/Time   WBC 13.3 (H) 08/23/2021 0059   RBC 3.23 (L) 08/23/2021 0059   HGB 11.5 (L) 08/23/2021 0059   HCT 31.6 (L) 08/23/2021 0059   HCT 30.2 (L) 07/02/2020 0826   PLT 292 08/23/2021 0059  MCV 97.8 08/23/2021 0059   MCH 35.6 (H) 08/23/2021 0059   MCHC 36.4 (H) 08/23/2021 0059   RDW 13.2 08/23/2021 0059   LYMPHSABS 2.7 07/02/2020 0257   MONOABS 0.8 07/02/2020 0257   EOSABS 0.0 07/02/2020 0257   BASOSABS 0.0 07/02/2020 0257   BMP Latest Ref Rng & Units 08/23/2021 08/21/2021 08/20/2021  Glucose 70 - 99 mg/dL 219(H) 207(H) 216(H)  BUN 6 - 20 mg/dL 12 10 13   Creatinine 0.44 - 1.00 mg/dL 1.07(H) 1.00 1.05(H)  Sodium 135 - 145 mmol/L 132(L) 136 138  Potassium 3.5 - 5.1 mmol/L 4.5 3.4(L) 5.2(H)  Chloride 98 - 111 mmol/L 105 109 110  CO2 22 - 32 mmol/L 18(L) 22 23  Calcium 8.9 - 10.3 mg/dL 8.6(L) 8.9 9.0   Chest imaging: CXR 08/31/21 Interval removal of thoracostomy tube, with small right pleural effusion, associated atelectasis/consolidation.  PFT: No flowsheet data found.  Labs: 08/20/21 -pleural fluid amylase 7000 141, cholesterol 62, glucose 159, LDH 83, triglycerides 29.  Assessment & Plan:   Pleural effusion on right  Discussion: Suzanne Weeks is a 40 year old woman, daily smoker with chronic pancreatitis and DMI who was admitted 9/6 to 9/9 for dyspnea and pleural effusion who presents to pulmonary clinic for hospital follow up.   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  She has been started on pancreatic enzymes for pancreatic insufficiency and there are no further procedures planned by GI or surgery at this time given the stability in her chest imaging.  She  has clear bilateral breath sounds on exam today which is reassuring that the pleural effusion has likely resolved.  She has plans for further follow-up imaging with her care team at [facility] in the next month.  I have instructed her to call our office if there is any reaccumulation of the pleural fluid and we can consider repeat thoracentesis if needed.  Follow-up as needed.  Freda Jackson, MD [facility] Office: [phone]     Current Outpatient Medications:    amLODipine (NORVASC) 5 MG tablet, Take 1 tablet (5 mg total) by mouth daily., Disp: 90 tablet, Rfl: 0   ascorbic acid (VITAMIN C) 500 MG tablet, Take 1,000 mg by mouth daily., Disp: , Rfl:    CREON 24000-76000 units CPEP, Take 1 capsule by mouth 3 (three) times daily., Disp: , Rfl:    insulin aspart (NOVOLOG FLEXPEN) 100 UNIT/ML FlexPen, Inject 1-4 Units into the skin 3 (three) times daily before meals. Sliding scale, Disp: , Rfl:    insulin degludec (TRESIBA) 100 UNIT/ML FlexTouch Pen, Inject 0.1 mLs (10 Units total) into the skin daily. (Patient taking differently: Inject 12 Units into the skin daily.), Disp: , Rfl:    irbesartan (AVAPRO) 150 MG tablet, Take 150 mg by mouth daily., Disp: , Rfl:    Multiple Vitamin (MULTIVITAMIN WITH MINERALS) TABS tablet, Take 1 tablet by mouth daily., Disp: , Rfl:    naproxen sodium (ALEVE) 220 MG tablet, Take 220 mg by mouth daily as needed (pain)., Disp: , Rfl:    nicotine (NICODERM CQ - DOSED IN MG/24 HOURS) 21 mg/24hr patch, Place 1 patch (21 mg total) onto the skin daily., Disp: 28 patch, Rfl: 0   vitamin B-12 (CYANOCOBALAMIN) 500 MCG tablet, Take 1 tablet (500 mcg total) by mouth daily., Disp: 60 tablet, Rfl: 3   Vitamin D, Ergocalciferol, (DRISDOL) 1.25 MG (50000 UNIT) CAPS capsule, Take 50,000 Units by  mouth once a week., Disp: , Rfl:

## 2021-09-14 ENCOUNTER — Encounter: Payer: Self-pay | Admitting: Pulmonary Disease

## 2021-09-19 ENCOUNTER — Other Ambulatory Visit (HOSPITAL_COMMUNITY): Payer: Self-pay | Admitting: Internal Medicine

## 2021-09-19 DIAGNOSIS — I10 Essential (primary) hypertension: Secondary | ICD-10-CM

## 2021-09-27 ENCOUNTER — Encounter (HOSPITAL_COMMUNITY): Payer: BC Managed Care – PPO

## 2023-04-22 ENCOUNTER — Other Ambulatory Visit (HOSPITAL_BASED_OUTPATIENT_CLINIC_OR_DEPARTMENT_OTHER): Payer: Self-pay

## 2023-04-23 ENCOUNTER — Other Ambulatory Visit (HOSPITAL_BASED_OUTPATIENT_CLINIC_OR_DEPARTMENT_OTHER): Payer: Self-pay

## 2023-04-23 MED ORDER — CREON 36000-114000 UNITS PO CPEP
36000.0000 [IU] | ORAL_CAPSULE | Freq: Two times a day (BID) | ORAL | 1 refills | Status: AC
  Filled 2023-04-23: qty 180, 90d supply, fill #0

## 2023-05-02 ENCOUNTER — Other Ambulatory Visit (HOSPITAL_BASED_OUTPATIENT_CLINIC_OR_DEPARTMENT_OTHER): Payer: Self-pay
